# Patient Record
Sex: Female | Born: 1945 | Race: White | Hispanic: No | Smoking: Never smoker
Health system: Southern US, Community
[De-identification: ages and names within clinical notes are randomized; demographics above are authoritative.]

## PROBLEM LIST (undated history)

## (undated) DIAGNOSIS — Z87442 Personal history of urinary calculi: Secondary | ICD-10-CM

## (undated) DIAGNOSIS — Z8489 Family history of other specified conditions: Secondary | ICD-10-CM

## (undated) DIAGNOSIS — E785 Hyperlipidemia, unspecified: Secondary | ICD-10-CM

## (undated) DIAGNOSIS — K219 Gastro-esophageal reflux disease without esophagitis: Secondary | ICD-10-CM

## (undated) DIAGNOSIS — R0602 Shortness of breath: Secondary | ICD-10-CM

## (undated) DIAGNOSIS — F329 Major depressive disorder, single episode, unspecified: Secondary | ICD-10-CM

## (undated) DIAGNOSIS — M1990 Unspecified osteoarthritis, unspecified site: Secondary | ICD-10-CM

## (undated) DIAGNOSIS — D649 Anemia, unspecified: Secondary | ICD-10-CM

## (undated) DIAGNOSIS — E039 Hypothyroidism, unspecified: Secondary | ICD-10-CM

## (undated) DIAGNOSIS — F32A Depression, unspecified: Secondary | ICD-10-CM

## (undated) DIAGNOSIS — I10 Essential (primary) hypertension: Secondary | ICD-10-CM

## (undated) DIAGNOSIS — C50919 Malignant neoplasm of unspecified site of unspecified female breast: Secondary | ICD-10-CM

## (undated) HISTORY — PX: CARDIAC CATHETERIZATION: SHX172

## (undated) HISTORY — DX: Essential (primary) hypertension: I10

## (undated) HISTORY — DX: Hyperlipidemia, unspecified: E78.5

## (undated) HISTORY — DX: Malignant neoplasm of unspecified site of unspecified female breast: C50.919

## (undated) HISTORY — PX: TONSILLECTOMY: SUR1361

## (undated) HISTORY — PX: VARICOSE VEIN SURGERY: SHX832

## (undated) HISTORY — PX: ABDOMINAL HYSTERECTOMY: SHX81

## (undated) HISTORY — PX: TRIGGER FINGER RELEASE: SHX641

---
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
## 1997-06-03 HISTORY — PX: OTHER SURGICAL HISTORY: SHX169

## 1997-06-03 HISTORY — PX: MASTECTOMY: SHX3

## 1998-03-21 ENCOUNTER — Encounter: Admission: RE | Admit: 1998-03-21 | Discharge: 1998-06-19 | Payer: Self-pay | Admitting: Radiation Oncology

## 2000-06-03 HISTORY — PX: DILATION AND CURETTAGE OF UTERUS: SHX78

## 2002-06-03 HISTORY — PX: VESICOVAGINAL FISTULA CLOSURE W/ TAH: SUR271

## 2002-06-03 HISTORY — PX: ABDOMINAL HYSTERECTOMY: SHX81

## 2007-02-09 ENCOUNTER — Ambulatory Visit: Payer: Self-pay | Admitting: Vascular Surgery

## 2007-05-12 ENCOUNTER — Ambulatory Visit: Payer: Self-pay | Admitting: Vascular Surgery

## 2007-06-22 ENCOUNTER — Ambulatory Visit: Payer: Self-pay | Admitting: Vascular Surgery

## 2007-06-30 ENCOUNTER — Ambulatory Visit: Payer: Self-pay | Admitting: Vascular Surgery

## 2007-08-27 ENCOUNTER — Ambulatory Visit: Payer: Self-pay | Admitting: Vascular Surgery

## 2007-12-08 ENCOUNTER — Ambulatory Visit: Payer: Self-pay | Admitting: Vascular Surgery

## 2008-09-28 ENCOUNTER — Encounter: Payer: Self-pay | Admitting: Pulmonary Disease

## 2008-09-30 ENCOUNTER — Encounter: Payer: Self-pay | Admitting: Pulmonary Disease

## 2008-10-06 ENCOUNTER — Encounter: Payer: Self-pay | Admitting: Pulmonary Disease

## 2008-11-11 ENCOUNTER — Encounter: Payer: Self-pay | Admitting: Pulmonary Disease

## 2008-11-28 ENCOUNTER — Ambulatory Visit: Payer: Self-pay | Admitting: Pulmonary Disease

## 2008-11-28 DIAGNOSIS — R05 Cough: Secondary | ICD-10-CM

## 2008-11-28 DIAGNOSIS — E785 Hyperlipidemia, unspecified: Secondary | ICD-10-CM

## 2008-11-28 DIAGNOSIS — I10 Essential (primary) hypertension: Secondary | ICD-10-CM | POA: Insufficient documentation

## 2008-11-28 DIAGNOSIS — J45909 Unspecified asthma, uncomplicated: Secondary | ICD-10-CM | POA: Insufficient documentation

## 2008-11-28 DIAGNOSIS — R0609 Other forms of dyspnea: Secondary | ICD-10-CM

## 2008-11-28 DIAGNOSIS — R0989 Other specified symptoms and signs involving the circulatory and respiratory systems: Secondary | ICD-10-CM | POA: Insufficient documentation

## 2008-11-28 HISTORY — DX: Essential (primary) hypertension: I10

## 2008-12-06 ENCOUNTER — Encounter: Payer: Self-pay | Admitting: Pulmonary Disease

## 2008-12-06 ENCOUNTER — Ambulatory Visit: Payer: Self-pay

## 2008-12-27 ENCOUNTER — Ambulatory Visit: Payer: Self-pay | Admitting: Vascular Surgery

## 2008-12-29 ENCOUNTER — Ambulatory Visit: Payer: Self-pay | Admitting: Pulmonary Disease

## 2008-12-29 DIAGNOSIS — I519 Heart disease, unspecified: Secondary | ICD-10-CM

## 2008-12-29 HISTORY — DX: Heart disease, unspecified: I51.9

## 2010-10-16 NOTE — Assessment & Plan Note (Signed)
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  She also had sclerotherapy.  She has  done well since that time but in May of this year took a 1 week driving  trip to [place].  She drove most of the route and noted that swelling  started becoming an issue and after she returned she had bilateral lower  extremity edema, right worse than left.  She has had hyperpigmentation  in both lower legs since prior to her ablation procedure.  Since  returning and being treated with diuretics her swelling has resolved and  her status is now the same as it was prior to the trip.  She has no  history of deep venous thrombosis or acute thrombophlebitis and has had  no dyspnea on exertion or hemoptysis.   PHYSICAL EXAMINATION:  Vital signs:  Today her blood pressure is 146/96,  heart rate 68, respirations 14.  Neck:  Her carotid pulses are 3+, no  audible bruits.  Chest:  Clear to auscultation.  Cardiovascular:  Regular rhythm.  No murmurs.  Lower extremity:  Exam reveals 3+ femoral  and dorsalis pedis pulses bilaterally.  Both legs have no significant  edema but do have some hyperpigmentation in the distal third with no  active ulceration and no large varicosities are noted.   Venous duplex exam of the right leg reveals a complete ablation of the  right great saphenous vein from the saphenofemoral junction to the knee  with some mild reflux in a small lateral branch.  There is no evidence  of deep venous obstruction.   I reassured her regarding these findings and I suspect that the edema  was due to riding in a dependent position for 7 days and some fluid  overload.  If she has further problems she will be in touch with [place].   Quita Skye Hart Rochester, M.D.  Electronically Signed   JDL/MEDQ  D:  12/27/2008  T:  12/28/2008  Job:  [id number]   cc:   Philemon Kingdom

## 2010-10-16 NOTE — Assessment & Plan Note (Signed)
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  She has  continued to have aching, burning, and throbbing discomfort in the  distal thigh and calf, particularly on the right side, even though she  has been wearing her elastic compression stockings.  She has tried  elevation of the leg, as well as analgesics with no success.  She  developed swelling in the foot and ankle area toward the end of the day  and has had significant hyperpigmentation on the medial aspect of the  lower third of both legs over the last several years.  She has not had  any stasis ulcer or bleeding, but the symptoms are attempting to effect  her daily living.  Ultrasound confirmed reflux of the right  saphenofemoral junction and the greater saphenous vein with a patent  deep system.  I think she is an excellent candidate for laser ablation  of her right greater saphenous vein and will require 2 sessions of  sclerotherapy in the calf and thigh for secondary varicosities.  One of  these can be done in conjunction with the laser ablation and one at a  later date.  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  This may require  closure with either sclerotherapy or the laser.  We will proceed with  precertification for the right leg and then perform her procedure in the  near future, as soon as this has been precertified.   Katelyn Watson, M.D.  Electronically Signed   JDL/MEDQ  D:  05/12/2007  T:  05/13/2007  Job:  [id number]

## 2010-10-16 NOTE — Assessment & Plan Note (Signed)
OFFICE VISIT   KRYSTOL, ROCCO  DOB:  1945/11/22                                       06/30/2007  EAVWU#:[id number]   The patient underwent laser ablation of the right greater saphenous vein  and sclerotherapy with some secondary varicosities 1 week ago.  She did  have moderate to severe discomfort in the right proximal thigh and  inguinal area for a few days following her laser ablation treatment.  She has been taking ibuprofen as prescribed.  She also had some  blistering around the thigh from her elastic portion of the long-leg  compression stocking.  This is all improved and now the discomfort is  much better, but still present.  She has no distal edema.   EXAM:  There is some mild tenderness along the course of the greater  saphenous vein as one would expect, with some mild erythema.  Skin  irritation where the blistering had occurred, but this is improving.  I  performed a venous duplex exam today and the saphenofemoral junction is  patent, but the saphenous vein is occluded from 2 cm from the junction  to the knee level, and not compressible.  Deep system is normal in  appearance with good flow.  She was reassured regarding this.  She will  return in the near future for further sclerotherapy by Marisue Ivan and return in  6 months for followup duplex scan of the right leg.  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   Quita Skye Hart Rochester, M.D.  Electronically Signed   JDL/MEDQ  D:  06/30/2007  T:  07/01/2007  Job:  [id number]

## 2010-10-16 NOTE — Procedures (Signed)
DUPLEX DEEP VENOUS EXAM - LOWER EXTREMITY   INDICATION:  Followup, right greater saphenous vein laser ablation.   HISTORY:  Edema:  No.  Trauma/Surgery:  Right greater saphenous vein laser ablation on  06/22/07.  Pain:  No.  PE:  No.  Previous DVT:  No.  Anticoagulants:  No.  Other:   DUPLEX EXAM:                CFV   SFV   PopV  PTV    GSV                R  L  R  L  R  L  R   L  R  L  Thrombosis    o  o  o     o     o      +  Spontaneous   +  +  +     +     +      0  Phasic        +  +  +     +     +      0  Augmentation  +  +  +     +     +      0  Compressible  +  +  +     +     +      0  Competent   Legend:  + - yes  o - no  p - partial  D - decreased   IMPRESSION:  1. No evidence of deep venous thrombosis noted in the right lower      extremity.  2. Totally occluded right greater saphenous vein with no evidence of      reflux noted at the saphenofemoral junction.    _____________________________  Quita Skye Hart Rochester, M.D.   CH/MEDQ  D:  12/08/2007  T:  12/08/2007  Job:  [id number]

## 2010-10-16 NOTE — Procedures (Signed)
LOWER EXTREMITY VENOUS REFLUX EXAM   INDICATION:  Bilateral legs varicose veins and pain.   EXAM:  Using color-flow imaging and pulse Doppler spectral analysis, the  right and left common femoral, superficial femoral vein, posterior  tibial, greater and lesser saphenous veins are evaluated.  There is no  evidence suggesting deep venous insufficiency in the right and left  lower extremity.  The right saphenofemoral junction is not competent.  The right GSV is not competent with the caliber as described below.The  right and left proximal short saphenous veins demonstrate competency.   GSV Diameter (used if found to be incompetent only)                                            Right    Left  Proximal Greater Saphenous Vein           .58 cm   .52 cm  Proximal-to-mid-thigh                     .5 cm    .41 cm  Mid thigh                                 .45 cm   .41 cm  Mid-distal thigh                          .45 cm   .35 cm  Distal thigh                              .33 cm   .35 cm  Knee                                      .39 cm   __________ cm   IMPRESSION:  1. Right greater saphenous vein reflux is identified with the caliber      ranging from 0.39 cm to 0.80 cm knee to groin.  2. The right and left greater saphenous vein is not aneurysmal.  3. The right and left greater saphenous vein is not tortuous.  4. The deep venous system is competent.  5. The right and left lesser saphenous vein is competent.  6. Perforators noted in bilateral legs at the level of discoloration.  7. No evidence of deep venous thrombosis noted in bilateral legs.   ___________________________________________  Quita Skye Hart Rochester, M.D.   MG/MEDQ  D:  02/09/2007  T:  02/10/2007  Job:  [id number]

## 2010-10-16 NOTE — Procedures (Signed)
DUPLEX DEEP VENOUS EXAM - LOWER EXTREMITY   INDICATION:  Swelling, bilateral lower extremities R > L after a driving  trip in May, better now.   HISTORY:  Edema:  None now, had in May after driving trip  Trauma/Surgery:  Greater saphenous vein ablation June 22, 2007, by  Dr. Hart Rochester  Pain:  No  PE:  No  Previous DVT:  No  Anticoagulants:  No  Other:   DUPLEX EXAM:                CFV   SFV   PopV  PTV    GSV                R  L  R  L  R  L  R   L  R  L  Thrombosis    o  o  o  o  o  o  o   o  +  o  Spontaneous   +  +  +  +  +  +  +   +  o  +  Phasic        +  +  +  +  +  +  +   +  o  +  Augmentation  +  +  +  +  +  +  +   +  o  +  Compressible  +  +  +  +  +  +  +   +  o  +  Competent     +  +  +  +  +  +  +   +  o  P   Legend:  + - yes  o - no  p - partial  D - decreased   IMPRESSION:  1. No evidence of deep vein thrombosis in bilateral lower extremities.  2. Right greater saphenous vein shows evidence of ablation without      flow from groin to knee.  Right small lateral tortuous branch with      mild reflux noted.  3. Left greater saphenous vein shows evidence of mild reflux.  4. Bilateral incompetent perforators as previously noted.    _____________________________  Quita Skye Hart Rochester, M.D.   AS/MEDQ  D:  12/27/2008  T:  12/28/2008  Job:  [id number]

## 2010-10-16 NOTE — Consult Note (Signed)
VASCULAR SURGERY CONSULTATION   Katelyn Watson, Katelyn Watson  DOB:  1945/07/13                                       02/09/2007  ZOXWR#:[id number]   The patient is a 65 year old female patient who has had increasingly  symptomatic varicose veins in both legs with the right worse than the  left.  She describes an aching, burning, throbbing discomfort which  begins in the distal thigh extending into the calf, worse on the right  than the left side.  This then develops numbness in her right leg with  standing which is only relieved by elevation of the legs.  She has tried  some mild pain medication without improvement.  She has not worn elastic  compression stockings.  She develops swelling toward the end of the day.  She has no history of thrombophlebitis or deep venous thrombosis, stasis  ulcer, or bleeding but states that these symptoms are definitely  affecting her daily living because of the pain involved.   PAST MEDICAL HISTORY:  Positive for hypertension, hyperlipidemia,  asthma, and a history of breast cancer, having undergone left mastectomy  in 1999.  She has no history of diabetes, coronary artery disease, or  COPD.   PREVIOUS SURGERY:  Left mastectomy.   FAMILY HISTORY:  Positive for coronary artery disease in her mother and  father.  Positive for diabetes in a brother.  Negative for strokes.   SOCIAL HISTORY:  She is widowed.  Has no children.  Is retired.  She  does not use tobacco or alcohol.   ALLERGIES:  NONE KNOWN.   MEDICATIONS AND REVIEW OF SYSTEMS:  Please see health history form.   PHYSICAL EXAM:  Vital signs:  Blood pressure is 130/90, heart rate 72,  respiratory rate 16.  General:  She is a healthy female patient who is  in no apparent distress.  She is alert and oriented x3.  Neck:  Supple  with 3+ carotid pulses palpable.  No bruits are audible.  Neurologic  exam:  Normal.  She has no palpable adenopathy in the neck.  Extremities:  Upper extremity  pulses are 3+ bilaterally.  Chest:  Clear  to auscultation.  Cardiovascular exam:  Regular rhythm with no murmurs.  Abdomen:  Obese.  No palpable masses.  Vascular:  She has 3+ femoral,  popliteal, and dorsalis pedis pulses palpable bilaterally.  There is  hyperpigmentation involving the lower third of both legs medially.  There is no ulceration.  On the right side, she has prominent  varicosities of the greater saphenous system in the distal thigh  extending down into the medial calf, particularly in the pretibial  region.  Left side has smaller varicosities in the distal thigh and into  the medial calf below the knee.  No posterior varicosities are noted.   Venous duplex exam was performed in our office today.  She does have  reflux at the right saphenofemoral junction and throughout the right  greater saphenous vein.  There is no reflux noted in the left greater  saphenous vein or in the small saphenous veins bilaterally.  She also  has two competent perforators, one on the lower third of the right leg  in the area of hyperpigmentation and a similar one on the left leg in  the area of hyperpigmentation.   We will treat initially with elastic compression  stockings, elevation,  analgesics, and see if this improves her symptoms.  She will return in  three months for further followup, and if she has had no improvement, I  think laser ablation to the right greater saphenous vein with stab  phlebectomies and/or sclerotherapy would be indicated.  She may later  require laser ablation of incompetent perforators in the lower third of  both legs.   Quita Skye Hart Rochester, M.D.  Electronically Signed  JDL/MEDQ  D:  02/09/2007  T:  02/10/2007  Job:  [id number]   cc:   Philemon Kingdom

## 2010-10-16 NOTE — Assessment & Plan Note (Signed)
OFFICE VISIT   Katelyn Watson, Katelyn Watson  DOB:  [birth year]                                       12/08/2007  QMVHQ#:[id number]   The patient returns 6 months post laser ablation of her right great  saphenous vein followed by sclerotherapy.  She also had sclerotherapy  performed of the left leg.  She had a venous duplex exam performed today  of the right leg which reveals total occlusion of the greater saphenous  vein from the saphenofemoral junction distally and no evidence of deep  venous thrombosis.   EXAM:  She continues to have hyperpigmentation in the lower part of both  legs with chronic venous insufficiency and I encouraged her to continue  to wear short leg elastic compression stockings.  She has palpable  arterial pulses and does have some scattered spider veins.   She is pleased with her results and states that the discomfort is much  less than she experienced before, and will return to see [place] on a p.r.n.  basis.   Quita Skye Hart Rochester, M.D.  Electronically Signed   JDL/MEDQ  D:  12/08/2007  T:  12/09/2007  Job:  [id number]

## 2011-05-08 DIAGNOSIS — C50919 Malignant neoplasm of unspecified site of unspecified female breast: Secondary | ICD-10-CM | POA: Insufficient documentation

## 2011-06-20 DIAGNOSIS — M1710 Unilateral primary osteoarthritis, unspecified knee: Secondary | ICD-10-CM | POA: Diagnosis not present

## 2011-06-20 DIAGNOSIS — M25569 Pain in unspecified knee: Secondary | ICD-10-CM | POA: Diagnosis not present

## 2011-06-27 DIAGNOSIS — M25569 Pain in unspecified knee: Secondary | ICD-10-CM | POA: Diagnosis not present

## 2011-07-03 DIAGNOSIS — M25569 Pain in unspecified knee: Secondary | ICD-10-CM | POA: Diagnosis not present

## 2011-07-22 DIAGNOSIS — E785 Hyperlipidemia, unspecified: Secondary | ICD-10-CM | POA: Diagnosis not present

## 2011-07-22 DIAGNOSIS — I10 Essential (primary) hypertension: Secondary | ICD-10-CM | POA: Diagnosis not present

## 2011-07-22 DIAGNOSIS — Z79899 Other long term (current) drug therapy: Secondary | ICD-10-CM | POA: Diagnosis not present

## 2011-07-22 DIAGNOSIS — E039 Hypothyroidism, unspecified: Secondary | ICD-10-CM | POA: Diagnosis not present

## 2011-07-22 DIAGNOSIS — R609 Edema, unspecified: Secondary | ICD-10-CM | POA: Diagnosis not present

## 2011-07-27 DIAGNOSIS — IMO0002 Reserved for concepts with insufficient information to code with codable children: Secondary | ICD-10-CM | POA: Diagnosis not present

## 2011-07-27 DIAGNOSIS — A938 Other specified arthropod-borne viral fevers: Secondary | ICD-10-CM | POA: Diagnosis not present

## 2011-08-16 DIAGNOSIS — Z8639 Personal history of other endocrine, nutritional and metabolic disease: Secondary | ICD-10-CM | POA: Diagnosis not present

## 2011-08-16 DIAGNOSIS — Z862 Personal history of diseases of the blood and blood-forming organs and certain disorders involving the immune mechanism: Secondary | ICD-10-CM | POA: Diagnosis not present

## 2011-08-16 DIAGNOSIS — Z923 Personal history of irradiation: Secondary | ICD-10-CM | POA: Diagnosis not present

## 2011-08-16 DIAGNOSIS — E041 Nontoxic single thyroid nodule: Secondary | ICD-10-CM | POA: Diagnosis not present

## 2011-09-03 DIAGNOSIS — M25569 Pain in unspecified knee: Secondary | ICD-10-CM | POA: Diagnosis not present

## 2011-09-03 DIAGNOSIS — M1710 Unilateral primary osteoarthritis, unspecified knee: Secondary | ICD-10-CM | POA: Diagnosis not present

## 2011-09-10 DIAGNOSIS — G4700 Insomnia, unspecified: Secondary | ICD-10-CM | POA: Diagnosis not present

## 2011-09-10 DIAGNOSIS — I10 Essential (primary) hypertension: Secondary | ICD-10-CM | POA: Diagnosis not present

## 2011-10-04 DIAGNOSIS — H40019 Open angle with borderline findings, low risk, unspecified eye: Secondary | ICD-10-CM | POA: Diagnosis not present

## 2011-10-21 DIAGNOSIS — K219 Gastro-esophageal reflux disease without esophagitis: Secondary | ICD-10-CM | POA: Diagnosis not present

## 2011-10-21 DIAGNOSIS — I10 Essential (primary) hypertension: Secondary | ICD-10-CM | POA: Diagnosis not present

## 2011-10-21 DIAGNOSIS — F329 Major depressive disorder, single episode, unspecified: Secondary | ICD-10-CM | POA: Diagnosis not present

## 2011-10-21 DIAGNOSIS — E785 Hyperlipidemia, unspecified: Secondary | ICD-10-CM | POA: Diagnosis not present

## 2011-10-21 DIAGNOSIS — Z79899 Other long term (current) drug therapy: Secondary | ICD-10-CM | POA: Diagnosis not present

## 2011-10-21 DIAGNOSIS — J309 Allergic rhinitis, unspecified: Secondary | ICD-10-CM | POA: Diagnosis not present

## 2011-10-21 DIAGNOSIS — N390 Urinary tract infection, site not specified: Secondary | ICD-10-CM | POA: Diagnosis not present

## 2011-10-21 DIAGNOSIS — E039 Hypothyroidism, unspecified: Secondary | ICD-10-CM | POA: Diagnosis not present

## 2012-02-21 DIAGNOSIS — K219 Gastro-esophageal reflux disease without esophagitis: Secondary | ICD-10-CM | POA: Diagnosis not present

## 2012-02-21 DIAGNOSIS — E039 Hypothyroidism, unspecified: Secondary | ICD-10-CM | POA: Diagnosis not present

## 2012-02-21 DIAGNOSIS — F329 Major depressive disorder, single episode, unspecified: Secondary | ICD-10-CM | POA: Diagnosis not present

## 2012-02-21 DIAGNOSIS — R609 Edema, unspecified: Secondary | ICD-10-CM | POA: Diagnosis not present

## 2012-02-21 DIAGNOSIS — E785 Hyperlipidemia, unspecified: Secondary | ICD-10-CM | POA: Diagnosis not present

## 2012-02-21 DIAGNOSIS — I10 Essential (primary) hypertension: Secondary | ICD-10-CM | POA: Diagnosis not present

## 2012-02-21 DIAGNOSIS — Z79899 Other long term (current) drug therapy: Secondary | ICD-10-CM | POA: Diagnosis not present

## 2012-03-31 DIAGNOSIS — M25569 Pain in unspecified knee: Secondary | ICD-10-CM | POA: Diagnosis not present

## 2012-03-31 DIAGNOSIS — M79609 Pain in unspecified limb: Secondary | ICD-10-CM | POA: Diagnosis not present

## 2012-03-31 DIAGNOSIS — R609 Edema, unspecified: Secondary | ICD-10-CM | POA: Diagnosis not present

## 2012-04-24 DIAGNOSIS — M25569 Pain in unspecified knee: Secondary | ICD-10-CM | POA: Diagnosis not present

## 2012-05-22 DIAGNOSIS — M25569 Pain in unspecified knee: Secondary | ICD-10-CM | POA: Diagnosis not present

## 2012-06-05 DIAGNOSIS — R922 Inconclusive mammogram: Secondary | ICD-10-CM | POA: Diagnosis not present

## 2012-06-05 DIAGNOSIS — Z1231 Encounter for screening mammogram for malignant neoplasm of breast: Secondary | ICD-10-CM | POA: Diagnosis not present

## 2012-06-05 DIAGNOSIS — Z853 Personal history of malignant neoplasm of breast: Secondary | ICD-10-CM | POA: Diagnosis not present

## 2012-06-05 DIAGNOSIS — C50919 Malignant neoplasm of unspecified site of unspecified female breast: Secondary | ICD-10-CM | POA: Diagnosis not present

## 2012-06-05 DIAGNOSIS — Z9010 Acquired absence of unspecified breast and nipple: Secondary | ICD-10-CM | POA: Diagnosis not present

## 2012-06-05 DIAGNOSIS — I10 Essential (primary) hypertension: Secondary | ICD-10-CM | POA: Diagnosis not present

## 2012-06-22 DIAGNOSIS — I10 Essential (primary) hypertension: Secondary | ICD-10-CM | POA: Diagnosis not present

## 2012-06-22 DIAGNOSIS — J0100 Acute maxillary sinusitis, unspecified: Secondary | ICD-10-CM | POA: Diagnosis not present

## 2012-06-22 DIAGNOSIS — E039 Hypothyroidism, unspecified: Secondary | ICD-10-CM | POA: Diagnosis not present

## 2012-06-22 DIAGNOSIS — Z79899 Other long term (current) drug therapy: Secondary | ICD-10-CM | POA: Diagnosis not present

## 2012-06-22 DIAGNOSIS — E785 Hyperlipidemia, unspecified: Secondary | ICD-10-CM | POA: Diagnosis not present

## 2012-06-22 DIAGNOSIS — K219 Gastro-esophageal reflux disease without esophagitis: Secondary | ICD-10-CM | POA: Diagnosis not present

## 2012-07-20 DIAGNOSIS — G4700 Insomnia, unspecified: Secondary | ICD-10-CM | POA: Diagnosis not present

## 2012-07-20 DIAGNOSIS — M2550 Pain in unspecified joint: Secondary | ICD-10-CM | POA: Diagnosis not present

## 2012-07-20 DIAGNOSIS — I10 Essential (primary) hypertension: Secondary | ICD-10-CM | POA: Diagnosis not present

## 2012-08-10 DIAGNOSIS — R1032 Left lower quadrant pain: Secondary | ICD-10-CM | POA: Diagnosis not present

## 2012-08-10 DIAGNOSIS — Z79899 Other long term (current) drug therapy: Secondary | ICD-10-CM | POA: Diagnosis not present

## 2012-08-10 DIAGNOSIS — I10 Essential (primary) hypertension: Secondary | ICD-10-CM | POA: Diagnosis not present

## 2012-08-21 DIAGNOSIS — E063 Autoimmune thyroiditis: Secondary | ICD-10-CM | POA: Diagnosis not present

## 2012-08-28 DIAGNOSIS — M1710 Unilateral primary osteoarthritis, unspecified knee: Secondary | ICD-10-CM | POA: Diagnosis not present

## 2012-09-07 DIAGNOSIS — Z79899 Other long term (current) drug therapy: Secondary | ICD-10-CM | POA: Diagnosis not present

## 2012-09-07 DIAGNOSIS — I10 Essential (primary) hypertension: Secondary | ICD-10-CM | POA: Diagnosis not present

## 2012-09-07 DIAGNOSIS — M62838 Other muscle spasm: Secondary | ICD-10-CM | POA: Diagnosis not present

## 2012-10-20 DIAGNOSIS — M1710 Unilateral primary osteoarthritis, unspecified knee: Secondary | ICD-10-CM | POA: Diagnosis not present

## 2012-10-20 DIAGNOSIS — M25569 Pain in unspecified knee: Secondary | ICD-10-CM | POA: Diagnosis not present

## 2012-11-12 DIAGNOSIS — R062 Wheezing: Secondary | ICD-10-CM | POA: Diagnosis not present

## 2012-11-12 DIAGNOSIS — J209 Acute bronchitis, unspecified: Secondary | ICD-10-CM | POA: Diagnosis not present

## 2012-12-03 DIAGNOSIS — H40019 Open angle with borderline findings, low risk, unspecified eye: Secondary | ICD-10-CM | POA: Diagnosis not present

## 2012-12-16 DIAGNOSIS — E785 Hyperlipidemia, unspecified: Secondary | ICD-10-CM | POA: Diagnosis not present

## 2012-12-16 DIAGNOSIS — I10 Essential (primary) hypertension: Secondary | ICD-10-CM | POA: Diagnosis not present

## 2012-12-16 DIAGNOSIS — Z79899 Other long term (current) drug therapy: Secondary | ICD-10-CM | POA: Diagnosis not present

## 2012-12-16 DIAGNOSIS — K219 Gastro-esophageal reflux disease without esophagitis: Secondary | ICD-10-CM | POA: Diagnosis not present

## 2012-12-16 DIAGNOSIS — R609 Edema, unspecified: Secondary | ICD-10-CM | POA: Diagnosis not present

## 2012-12-16 DIAGNOSIS — E039 Hypothyroidism, unspecified: Secondary | ICD-10-CM | POA: Diagnosis not present

## 2013-02-08 DIAGNOSIS — S8010XA Contusion of unspecified lower leg, initial encounter: Secondary | ICD-10-CM | POA: Diagnosis not present

## 2013-02-25 DIAGNOSIS — M1710 Unilateral primary osteoarthritis, unspecified knee: Secondary | ICD-10-CM | POA: Diagnosis not present

## 2013-02-25 DIAGNOSIS — M25569 Pain in unspecified knee: Secondary | ICD-10-CM | POA: Diagnosis not present

## 2013-04-01 DIAGNOSIS — Z23 Encounter for immunization: Secondary | ICD-10-CM | POA: Diagnosis not present

## 2013-04-19 DIAGNOSIS — M2550 Pain in unspecified joint: Secondary | ICD-10-CM | POA: Diagnosis not present

## 2013-04-19 DIAGNOSIS — E039 Hypothyroidism, unspecified: Secondary | ICD-10-CM | POA: Diagnosis not present

## 2013-04-19 DIAGNOSIS — H6120 Impacted cerumen, unspecified ear: Secondary | ICD-10-CM | POA: Diagnosis not present

## 2013-04-19 DIAGNOSIS — E785 Hyperlipidemia, unspecified: Secondary | ICD-10-CM | POA: Diagnosis not present

## 2013-04-19 DIAGNOSIS — I10 Essential (primary) hypertension: Secondary | ICD-10-CM | POA: Diagnosis not present

## 2013-04-19 DIAGNOSIS — J309 Allergic rhinitis, unspecified: Secondary | ICD-10-CM | POA: Diagnosis not present

## 2013-04-19 DIAGNOSIS — R05 Cough: Secondary | ICD-10-CM | POA: Diagnosis not present

## 2013-04-19 DIAGNOSIS — Z79899 Other long term (current) drug therapy: Secondary | ICD-10-CM | POA: Diagnosis not present

## 2013-04-21 DIAGNOSIS — H6120 Impacted cerumen, unspecified ear: Secondary | ICD-10-CM | POA: Diagnosis not present

## 2013-05-12 DIAGNOSIS — E876 Hypokalemia: Secondary | ICD-10-CM | POA: Diagnosis not present

## 2013-05-13 DIAGNOSIS — J45909 Unspecified asthma, uncomplicated: Secondary | ICD-10-CM | POA: Diagnosis not present

## 2013-05-13 DIAGNOSIS — J309 Allergic rhinitis, unspecified: Secondary | ICD-10-CM | POA: Diagnosis not present

## 2013-05-13 DIAGNOSIS — E876 Hypokalemia: Secondary | ICD-10-CM | POA: Diagnosis not present

## 2013-05-13 DIAGNOSIS — R5381 Other malaise: Secondary | ICD-10-CM | POA: Diagnosis not present

## 2013-05-21 DIAGNOSIS — L02419 Cutaneous abscess of limb, unspecified: Secondary | ICD-10-CM | POA: Diagnosis not present

## 2013-05-21 DIAGNOSIS — R609 Edema, unspecified: Secondary | ICD-10-CM | POA: Diagnosis not present

## 2013-06-09 DIAGNOSIS — R922 Inconclusive mammogram: Secondary | ICD-10-CM | POA: Diagnosis not present

## 2013-06-09 DIAGNOSIS — C50919 Malignant neoplasm of unspecified site of unspecified female breast: Secondary | ICD-10-CM | POA: Diagnosis not present

## 2013-06-09 DIAGNOSIS — Z1231 Encounter for screening mammogram for malignant neoplasm of breast: Secondary | ICD-10-CM | POA: Diagnosis not present

## 2013-06-10 DIAGNOSIS — R5383 Other fatigue: Secondary | ICD-10-CM | POA: Diagnosis not present

## 2013-06-10 DIAGNOSIS — M19049 Primary osteoarthritis, unspecified hand: Secondary | ICD-10-CM | POA: Diagnosis not present

## 2013-06-10 DIAGNOSIS — R894 Abnormal immunological findings in specimens from other organs, systems and tissues: Secondary | ICD-10-CM | POA: Diagnosis not present

## 2013-06-10 DIAGNOSIS — R5381 Other malaise: Secondary | ICD-10-CM | POA: Diagnosis not present

## 2013-06-10 DIAGNOSIS — M7730 Calcaneal spur, unspecified foot: Secondary | ICD-10-CM | POA: Diagnosis not present

## 2013-06-10 DIAGNOSIS — M1990 Unspecified osteoarthritis, unspecified site: Secondary | ICD-10-CM | POA: Diagnosis not present

## 2013-06-10 DIAGNOSIS — M2550 Pain in unspecified joint: Secondary | ICD-10-CM | POA: Diagnosis not present

## 2013-07-01 DIAGNOSIS — M1990 Unspecified osteoarthritis, unspecified site: Secondary | ICD-10-CM | POA: Diagnosis not present

## 2013-07-01 DIAGNOSIS — M2550 Pain in unspecified joint: Secondary | ICD-10-CM | POA: Diagnosis not present

## 2013-07-01 DIAGNOSIS — R894 Abnormal immunological findings in specimens from other organs, systems and tissues: Secondary | ICD-10-CM | POA: Diagnosis not present

## 2013-08-05 DIAGNOSIS — G4700 Insomnia, unspecified: Secondary | ICD-10-CM | POA: Diagnosis not present

## 2013-08-05 DIAGNOSIS — E039 Hypothyroidism, unspecified: Secondary | ICD-10-CM | POA: Diagnosis not present

## 2013-08-05 DIAGNOSIS — Z79899 Other long term (current) drug therapy: Secondary | ICD-10-CM | POA: Diagnosis not present

## 2013-08-05 DIAGNOSIS — I10 Essential (primary) hypertension: Secondary | ICD-10-CM | POA: Diagnosis not present

## 2013-08-05 DIAGNOSIS — E785 Hyperlipidemia, unspecified: Secondary | ICD-10-CM | POA: Diagnosis not present

## 2013-08-05 DIAGNOSIS — K219 Gastro-esophageal reflux disease without esophagitis: Secondary | ICD-10-CM | POA: Diagnosis not present

## 2013-08-05 DIAGNOSIS — R609 Edema, unspecified: Secondary | ICD-10-CM | POA: Diagnosis not present

## 2013-08-27 DIAGNOSIS — E063 Autoimmune thyroiditis: Secondary | ICD-10-CM | POA: Diagnosis not present

## 2013-09-20 DIAGNOSIS — M1710 Unilateral primary osteoarthritis, unspecified knee: Secondary | ICD-10-CM | POA: Diagnosis not present

## 2013-09-20 DIAGNOSIS — IMO0002 Reserved for concepts with insufficient information to code with codable children: Secondary | ICD-10-CM | POA: Diagnosis not present

## 2013-10-07 DIAGNOSIS — M25569 Pain in unspecified knee: Secondary | ICD-10-CM | POA: Diagnosis not present

## 2013-10-07 DIAGNOSIS — M1710 Unilateral primary osteoarthritis, unspecified knee: Secondary | ICD-10-CM | POA: Diagnosis not present

## 2013-11-09 DIAGNOSIS — D235 Other benign neoplasm of skin of trunk: Secondary | ICD-10-CM | POA: Diagnosis not present

## 2013-11-09 DIAGNOSIS — L259 Unspecified contact dermatitis, unspecified cause: Secondary | ICD-10-CM | POA: Diagnosis not present

## 2013-11-09 DIAGNOSIS — L723 Sebaceous cyst: Secondary | ICD-10-CM | POA: Diagnosis not present

## 2013-11-18 DIAGNOSIS — J019 Acute sinusitis, unspecified: Secondary | ICD-10-CM | POA: Diagnosis not present

## 2013-12-07 DIAGNOSIS — E785 Hyperlipidemia, unspecified: Secondary | ICD-10-CM | POA: Diagnosis not present

## 2013-12-07 DIAGNOSIS — F3289 Other specified depressive episodes: Secondary | ICD-10-CM | POA: Diagnosis not present

## 2013-12-07 DIAGNOSIS — E039 Hypothyroidism, unspecified: Secondary | ICD-10-CM | POA: Diagnosis not present

## 2013-12-07 DIAGNOSIS — I10 Essential (primary) hypertension: Secondary | ICD-10-CM | POA: Diagnosis not present

## 2013-12-07 DIAGNOSIS — K219 Gastro-esophageal reflux disease without esophagitis: Secondary | ICD-10-CM | POA: Diagnosis not present

## 2013-12-07 DIAGNOSIS — F329 Major depressive disorder, single episode, unspecified: Secondary | ICD-10-CM | POA: Diagnosis not present

## 2013-12-07 DIAGNOSIS — Z79899 Other long term (current) drug therapy: Secondary | ICD-10-CM | POA: Diagnosis not present

## 2013-12-09 DIAGNOSIS — M1710 Unilateral primary osteoarthritis, unspecified knee: Secondary | ICD-10-CM | POA: Diagnosis not present

## 2013-12-09 DIAGNOSIS — M25569 Pain in unspecified knee: Secondary | ICD-10-CM | POA: Diagnosis not present

## 2013-12-15 DIAGNOSIS — M79609 Pain in unspecified limb: Secondary | ICD-10-CM | POA: Diagnosis not present

## 2013-12-20 DIAGNOSIS — M79609 Pain in unspecified limb: Secondary | ICD-10-CM | POA: Diagnosis not present

## 2013-12-22 DIAGNOSIS — M79609 Pain in unspecified limb: Secondary | ICD-10-CM | POA: Diagnosis not present

## 2013-12-23 DIAGNOSIS — S99919A Unspecified injury of unspecified ankle, initial encounter: Secondary | ICD-10-CM | POA: Diagnosis not present

## 2013-12-23 DIAGNOSIS — S99929A Unspecified injury of unspecified foot, initial encounter: Secondary | ICD-10-CM | POA: Diagnosis not present

## 2013-12-23 DIAGNOSIS — S8990XA Unspecified injury of unspecified lower leg, initial encounter: Secondary | ICD-10-CM | POA: Diagnosis not present

## 2013-12-27 DIAGNOSIS — M79609 Pain in unspecified limb: Secondary | ICD-10-CM | POA: Diagnosis not present

## 2013-12-28 DIAGNOSIS — I10 Essential (primary) hypertension: Secondary | ICD-10-CM | POA: Diagnosis not present

## 2013-12-28 DIAGNOSIS — K219 Gastro-esophageal reflux disease without esophagitis: Secondary | ICD-10-CM | POA: Diagnosis not present

## 2013-12-28 DIAGNOSIS — Z01818 Encounter for other preprocedural examination: Secondary | ICD-10-CM | POA: Diagnosis not present

## 2013-12-28 DIAGNOSIS — E785 Hyperlipidemia, unspecified: Secondary | ICD-10-CM | POA: Diagnosis not present

## 2013-12-28 DIAGNOSIS — E876 Hypokalemia: Secondary | ICD-10-CM | POA: Diagnosis not present

## 2013-12-28 DIAGNOSIS — F3289 Other specified depressive episodes: Secondary | ICD-10-CM | POA: Diagnosis not present

## 2013-12-28 DIAGNOSIS — F329 Major depressive disorder, single episode, unspecified: Secondary | ICD-10-CM | POA: Diagnosis not present

## 2013-12-29 DIAGNOSIS — M1990 Unspecified osteoarthritis, unspecified site: Secondary | ICD-10-CM | POA: Diagnosis not present

## 2013-12-29 DIAGNOSIS — R894 Abnormal immunological findings in specimens from other organs, systems and tissues: Secondary | ICD-10-CM | POA: Diagnosis not present

## 2013-12-29 DIAGNOSIS — M2550 Pain in unspecified joint: Secondary | ICD-10-CM | POA: Diagnosis not present

## 2013-12-31 DIAGNOSIS — M79609 Pain in unspecified limb: Secondary | ICD-10-CM | POA: Diagnosis not present

## 2014-01-03 DIAGNOSIS — M79609 Pain in unspecified limb: Secondary | ICD-10-CM | POA: Diagnosis not present

## 2014-01-06 NOTE — H&P (Signed)
TOTAL KNEE ADMISSION H&P  Patient is being admitted for left total knee arthroplasty.  Subjective:  Chief Complaint:    Left knee OA / pain.  HPI: Katelyn Watson, 68 y.o. female, has a history of pain and functional disability in the left knee due to arthritis and has failed non-surgical conservative treatments for greater than 12 weeks to include NSAID's and/or analgesics, corticosteriod injections, use of assistive devices and activity modification.  Onset of symptoms was gradual, starting 2+ years ago with gradually worsening course since that time. The patient noted no past surgery on the left knee(s).  Patient currently rates pain in the left knee(s) at 10 out of 10 with activity. Patient has night pain, worsening of pain with activity and weight bearing, pain that interferes with activities of daily living, pain with passive range of motion, crepitus and joint swelling.  Patient has evidence of periarticular osteophytes and joint space narrowing by imaging studies. There is no active infection.  Risks, benefits and expectations were discussed with the patient.  Risks including but not limited to the risk of anesthesia, blood clots, nerve damage, blood vessel damage, failure of the prosthesis, infection and up to and including death.  Patient understand the risks, benefits and expectations and wishes to proceed with surgery.   PCP: No primary provider on file.  D/C Plans:      SNF - [facility]  Post-op Meds:       No Rx given  Tranexamic Acid:      To be given - IV    Decadron:      Is to be given  FYI:     ASA post-op  Norco post-op    Patient Active Problem List   Diagnosis Date Noted  . DIASTOLIC DYSFUNCTION 43/56/8616  . HYPERLIPIDEMIA 11/28/2008  . HYPERTENSION 11/28/2008  . ASTHMA 11/28/2008  . DYSPNEA ON EXERTION 11/28/2008  . COUGH 11/28/2008   Past Medical History  Diagnosis Date  . Asthma     ??  . HLD (hyperlipidemia)   . HTN (hypertension)   . Breast cancer    treated with masectomy, chemo, and xrt    Past Surgical History  Procedure Laterality Date  . Vesicovaginal fistula closure w/ tah  2004  . Breast masectomy  1999  . Dilation and curettage of uterus  2002  . Cardiac catheterization      No prescriptions prior to admission   Allergies not on file   History  Substance Use Topics  . Smoking status: Never Smoker   . Smokeless tobacco: Not on file  . Alcohol Use: Not on file    Family History  Problem Relation Age of Onset  . Heart disease      father and brother (afib); mother (MI)  . Cancer      mother (colon); brother (melanoma, lung)     Review of Systems  Constitutional: Negative.   HENT: Negative.   Eyes: Negative.   Respiratory: Positive for cough and shortness of breath (on exertion).   Cardiovascular: Positive for leg swelling.  Gastrointestinal: Positive for heartburn.  Genitourinary: Positive for frequency.  Musculoskeletal: Positive for back pain and joint pain.  Skin: Negative.   Neurological: Negative.   Endo/Heme/Allergies: Negative.   Psychiatric/Behavioral: Positive for depression.    Objective:  Physical Exam  Constitutional: She is oriented to person, place, and time. She appears well-developed and well-nourished.  HENT:  Head: Normocephalic and atraumatic.  Mouth/Throat: Oropharynx is clear and moist.  Eyes: Pupils are  equal, round, and reactive to light.  Neck: Neck supple. No JVD present. No tracheal deviation present. No thyromegaly present.  Cardiovascular: Normal rate, regular rhythm, normal heart sounds and intact distal pulses.   Respiratory: Effort normal and breath sounds normal. No stridor. No respiratory distress. She has no wheezes.  GI: Soft. There is no tenderness. There is no guarding.  Musculoskeletal:       Left knee: She exhibits decreased range of motion, swelling and bony tenderness. She exhibits no ecchymosis, no deformity, no laceration, no erythema and normal alignment.  Tenderness found.  Lymphadenopathy:    She has no cervical adenopathy.  Neurological: She is alert and oriented to person, place, and time.  Skin: Skin is warm and dry.  Psychiatric: She has a normal mood and affect.      Labs:  Estimated body mass index is 40.63 kg/(m^2) as calculated from the following:   Height as of 12/29/08: 5' 6.5" (1.689 m).   Weight as of 12/29/08: 115.894 kg (255 lb 8 oz).   Imaging Review Plain radiographs demonstrate moderate degenerative joint disease of the left knee(s). The overall alignment is neutral. The bone quality appears to be good for age and reported activity level.  Assessment/Plan:  End stage arthritis, left knee   The patient history, physical examination, clinical judgment of the provider and imaging studies are consistent with end stage degenerative joint disease of the left knee(s) and total knee arthroplasty is deemed medically necessary. The treatment options including medical management, injection therapy arthroscopy and arthroplasty were discussed at length. The risks and benefits of total knee arthroplasty were presented and reviewed. The risks due to aseptic loosening, infection, stiffness, patella tracking problems, thromboembolic complications and other imponderables were discussed. The patient acknowledged the explanation, agreed to proceed with the plan and consent was signed. Patient is being admitted for inpatient treatment for surgery, pain control, PT, OT, prophylactic antibiotics, VTE prophylaxis, progressive ambulation and ADL's and discharge planning. The patient is planning to be discharged to skilled nursing facility.      [place] Pugh Chantele Corado   PA-C  01/06/2014, 2:57 PM

## 2014-01-10 NOTE — Patient Instructions (Signed)
Katelyn Watson  01/10/2014   Your procedure is scheduled on:  01/17/2014    Report to [facility].  Follow the Signs to [place] at  1130      am  Call this number if you have problems the morning of surgery: [phone]   Remember:   Do not eat food after midnite.  May have clear liquids until 0800am then npo.    Take these medicines the morning of surgery with A SIP OF WATER:    Do not wear jewelry, make-up or nail polish.  Do not wear lotions, powders, or perfumes, deodorant.     Men may shave face and neck.  Do not bring valuables to the hospital.  Contacts, dentures or bridgework may not be worn into surgery.  Leave suitcase in the car. After surgery it may be brought to your room.  For patients admitted to the hospital, checkout time is 11:00 AM the day of  discharge.         CLEAR LIQUID DIET   Foods Allowed                                                                     Foods Excluded  Coffee and tea, regular and decaf                             liquids that you cannot  Plain Jell-O in any flavor                                             see through such as: Fruit ices (not with fruit pulp)                                     milk, soups, orange juice  Iced Popsicles                                    All solid food Carbonated beverages, regular and diet                                    Cranberry, grape and apple juices Sports drinks like Gatorade Lightly seasoned clear broth or consume(fat free) Sugar, honey syrup  Sample Menu Breakfast                                Lunch                                     Supper Cranberry juice                    Beef broth  Chicken broth Jell-O                                     Grape juice                           Apple juice Coffee or tea                        Jell-O                                      Popsicle                                                Coffee or  tea                        Coffee or tea  _____________________________________________________________________  [facility] - Preparing for Surgery Before surgery, you can play an important role.  Because skin is not sterile, your skin needs to be as free of germs as possible.  You can reduce the number of germs on your skin by washing with CHG (chlorahexidine gluconate) soap before surgery.  CHG is an antiseptic cleaner which kills germs and bonds with the skin to continue killing germs even after washing. Please DO NOT use if you have an allergy to CHG or antibacterial soaps.  If your skin becomes reddened/irritated stop using the CHG and inform your nurse when you arrive at Short Stay. Do not shave (including legs and underarms) for at least 48 hours prior to the first CHG shower.  You may shave your face/neck. Please follow these instructions carefully:  1.  Shower with CHG Soap the night before surgery and the  morning of Surgery.  2.  If you choose to wash your hair, wash your hair first as usual with your  normal  shampoo.  3.  After you shampoo, rinse your hair and body thoroughly to remove the  shampoo.                           4.  Use CHG as you would any other liquid soap.  You can apply chg directly  to the skin and wash                       Gently with a scrungie or clean washcloth.  5.  Apply the CHG Soap to your body ONLY FROM THE NECK DOWN.   Do not use on face/ open                           Wound or open sores. Avoid contact with eyes, ears mouth and genitals (private parts).                       Wash face,  Genitals (private parts) with your normal soap.             6.  Wash thoroughly, paying special attention to the area  where your surgery  will be performed.  7.  Thoroughly rinse your body with warm water from the neck down.  8.  DO NOT shower/wash with your normal soap after using and rinsing off  the CHG Soap.                9.  Pat yourself dry with a clean towel.             10.  Wear clean pajamas.            11.  Place clean sheets on your bed the night of your first shower and do not  sleep with pets. Day of Surgery : Do not apply any lotions/deodorants the morning of surgery.  Please wear clean clothes to the hospital/surgery center.  FAILURE TO FOLLOW THESE INSTRUCTIONS MAY RESULT IN THE CANCELLATION OF YOUR SURGERY PATIENT SIGNATURE_________________________________  NURSE SIGNATURE__________________________________  ________________________________________________________________________  WHAT IS A BLOOD TRANSFUSION? Blood Transfusion Information  A transfusion is the replacement of blood or some of its parts. Blood is made up of multiple cells which provide different functions.  Red blood cells carry oxygen and are used for blood loss replacement.  White blood cells fight against infection.  Platelets control bleeding.  Plasma helps clot blood.  Other blood products are available for specialized needs, such as hemophilia or other clotting disorders. BEFORE THE TRANSFUSION  Who gives blood for transfusions?   Healthy volunteers who are fully evaluated to make sure their blood is safe. This is blood bank blood. Transfusion therapy is the safest it has ever been in the practice of medicine. Before blood is taken from a donor, a complete history is taken to make sure that person has no history of diseases nor engages in risky social behavior (examples are intravenous drug use or sexual activity with multiple partners). The donor's travel history is screened to minimize risk of transmitting infections, such as malaria. The donated blood is tested for signs of infectious diseases, such as HIV and hepatitis. The blood is then tested to be sure it is compatible with you in order to minimize the chance of a transfusion reaction. If you or a relative donates blood, this is often done in anticipation of surgery and is not appropriate for emergency  situations. It takes many days to process the donated blood. RISKS AND COMPLICATIONS Although transfusion therapy is very safe and saves many lives, the main dangers of transfusion include:   Getting an infectious disease.  Developing a transfusion reaction. This is an allergic reaction to something in the blood you were given. Every precaution is taken to prevent this. The decision to have a blood transfusion has been considered carefully by your caregiver before blood is given. Blood is not given unless the benefits outweigh the risks. AFTER THE TRANSFUSION  Right after receiving a blood transfusion, you will usually feel much better and more energetic. This is especially true if your red blood cells have gotten low (anemic). The transfusion raises the level of the red blood cells which carry oxygen, and this usually causes an energy increase.  The nurse administering the transfusion will monitor you carefully for complications. HOME CARE INSTRUCTIONS  No special instructions are needed after a transfusion. You may find your energy is better. Speak with your caregiver about any limitations on activity for underlying diseases you may have. SEEK MEDICAL CARE IF:   Your condition is not improving after your transfusion.  You develop redness or irritation at the intravenous (  IV) site. SEEK IMMEDIATE MEDICAL CARE IF:  Any of the following symptoms occur over the next 12 hours:  Shaking chills.  You have a temperature by mouth above 102 F (38.9 C), not controlled by medicine.  Chest, back, or muscle pain.  People around you feel you are not acting correctly or are confused.  Shortness of breath or difficulty breathing.  Dizziness and fainting.  You get a rash or develop hives.  You have a decrease in urine output.  Your urine turns a dark color or changes to pink, red, or brown. Any of the following symptoms occur over the next 10 days:  You have a temperature by mouth above  102 F (38.9 C), not controlled by medicine.  Shortness of breath.  Weakness after normal activity.  The white part of the eye turns yellow (jaundice).  You have a decrease in the amount of urine or are urinating less often.  Your urine turns a dark color or changes to pink, red, or brown. Document Released: 05/17/2000 Document Revised: 08/12/2011 Document Reviewed: 01/04/2008 ExitCare Patient Information 2014 [place].  _______________________________________________________________________  Incentive Spirometer  An incentive spirometer is a tool that can help keep your lungs clear and active. This tool measures how well you are filling your lungs with each breath. Taking long deep breaths may help reverse or decrease the chance of developing breathing (pulmonary) problems (especially infection) following:  A long period of time when you are unable to move or be active. BEFORE THE PROCEDURE   If the spirometer includes an indicator to show your best effort, your nurse or respiratory therapist will set it to a desired goal.  If possible, sit up straight or lean slightly forward. Try not to slouch.  Hold the incentive spirometer in an upright position. INSTRUCTIONS FOR USE  1. Sit on the edge of your bed if possible, or sit up as far as you can in bed or on a chair. 2. Hold the incentive spirometer in an upright position. 3. Breathe out normally. 4. Place the mouthpiece in your mouth and seal your lips tightly around it. 5. Breathe in slowly and as deeply as possible, raising the piston or the ball toward the top of the column. 6. Hold your breath for 3-5 seconds or for as long as possible. Allow the piston or ball to fall to the bottom of the column. 7. Remove the mouthpiece from your mouth and breathe out normally. 8. Rest for a few seconds and repeat Steps 1 through 7 at least 10 times every 1-2 hours when you are awake. Take your time and take a few normal breaths  between deep breaths. 9. The spirometer may include an indicator to show your best effort. Use the indicator as a goal to work toward during each repetition. 10. After each set of 10 deep breaths, practice coughing to be sure your lungs are clear. If you have an incision (the cut made at the time of surgery), support your incision when coughing by placing a pillow or rolled up towels firmly against it. Once you are able to get out of bed, walk around indoors and cough well. You may stop using the incentive spirometer when instructed by your caregiver.  RISKS AND COMPLICATIONS  Take your time so you do not get dizzy or light-headed.  If you are in pain, you may need to take or ask for pain medication before doing incentive spirometry. It is harder to take a deep breath if you  are having pain. AFTER USE  Rest and breathe slowly and easily.  It can be helpful to keep track of a log of your progress. Your caregiver can provide you with a simple table to help with this. If you are using the spirometer at home, follow these instructions: [place] IF:   You are having difficultly using the spirometer.  You have trouble using the spirometer as often as instructed.  Your pain medication is not giving enough relief while using the spirometer.  You develop fever of 100.5 F (38.1 C) or higher. SEEK IMMEDIATE MEDICAL CARE IF:   You cough up bloody sputum that had not been present before.  You develop fever of 102 F (38.9 C) or greater.  You develop worsening pain at or near the incision site. MAKE SURE YOU:   Understand these instructions.  Will watch your condition.  Will get help right away if you are not doing well or get worse. Document Released: 09/30/2006 Document Revised: 08/12/2011 Document Reviewed: 12/01/2006 ExitCare Patient Information 2014 ExitCare, [place].   ________________________________________________________________________    Please read over the  following fact sheets that you were given: MRSA Information, coughing and deep breathing exercises, leg exercises

## 2014-01-11 ENCOUNTER — Encounter (HOSPITAL_COMMUNITY): Payer: Self-pay

## 2014-01-11 ENCOUNTER — Encounter (HOSPITAL_COMMUNITY): Payer: Self-pay | Admitting: Pharmacy Technician

## 2014-01-11 ENCOUNTER — Ambulatory Visit (HOSPITAL_COMMUNITY)
Admission: RE | Admit: 2014-01-11 | Discharge: 2014-01-11 | Disposition: A | Discharge status: Home / Self Care | Payer: Medicare Other | Source: Ambulatory Visit | Attending: Orthopedic Surgery | Admitting: Orthopedic Surgery

## 2014-01-11 ENCOUNTER — Encounter (HOSPITAL_COMMUNITY)
Admission: RE | Admit: 2014-01-11 | Discharge: 2014-01-11 | Disposition: A | Discharge status: Home / Self Care | Payer: Medicare Other | Source: Ambulatory Visit | Attending: Orthopedic Surgery | Admitting: Orthopedic Surgery

## 2014-01-11 ENCOUNTER — Encounter (INDEPENDENT_AMBULATORY_CARE_PROVIDER_SITE_OTHER): Payer: Self-pay

## 2014-01-11 DIAGNOSIS — J984 Other disorders of lung: Secondary | ICD-10-CM | POA: Insufficient documentation

## 2014-01-11 DIAGNOSIS — Z01812 Encounter for preprocedural laboratory examination: Secondary | ICD-10-CM | POA: Diagnosis not present

## 2014-01-11 DIAGNOSIS — Z01818 Encounter for other preprocedural examination: Secondary | ICD-10-CM | POA: Insufficient documentation

## 2014-01-11 DIAGNOSIS — I10 Essential (primary) hypertension: Secondary | ICD-10-CM | POA: Diagnosis not present

## 2014-01-11 DIAGNOSIS — I517 Cardiomegaly: Secondary | ICD-10-CM | POA: Insufficient documentation

## 2014-01-11 HISTORY — DX: Hypothyroidism, unspecified: E03.9

## 2014-01-11 HISTORY — DX: Depression, unspecified: F32.A

## 2014-01-11 HISTORY — DX: Major depressive disorder, single episode, unspecified: F32.9

## 2014-01-11 HISTORY — DX: Family history of other specified conditions: Z84.89

## 2014-01-11 HISTORY — DX: Shortness of breath: R06.02

## 2014-01-11 HISTORY — DX: Anemia, unspecified: D64.9

## 2014-01-11 HISTORY — DX: Gastro-esophageal reflux disease without esophagitis: K21.9

## 2014-01-11 HISTORY — DX: Unspecified osteoarthritis, unspecified site: M19.90

## 2014-01-11 LAB — PROTIME-INR
INR: 1.07 (ref 0.00–1.49)
Prothrombin Time: 13.9 seconds (ref 11.6–15.2)

## 2014-01-11 LAB — URINE MICROSCOPIC-ADD ON

## 2014-01-11 LAB — URINALYSIS, ROUTINE W REFLEX MICROSCOPIC
Bilirubin Urine: NEGATIVE
GLUCOSE, UA: NEGATIVE mg/dL
Ketones, ur: NEGATIVE mg/dL
Nitrite: NEGATIVE
Protein, ur: NEGATIVE mg/dL
Specific Gravity, Urine: 1.015 (ref 1.005–1.030)
Urobilinogen, UA: 0.2 mg/dL (ref 0.0–1.0)
pH: 6.5 (ref 5.0–8.0)

## 2014-01-11 LAB — BASIC METABOLIC PANEL
ANION GAP: 11 (ref 5–15)
BUN: 16 mg/dL (ref 6–23)
CHLORIDE: 100 meq/L (ref 96–112)
CO2: 29 mEq/L (ref 19–32)
CREATININE: 1.02 mg/dL (ref 0.50–1.10)
Calcium: 9.6 mg/dL (ref 8.4–10.5)
GFR, EST AFRICAN AMERICAN: 64 mL/min — AB (ref >90)
GFR, EST NON AFRICAN AMERICAN: 55 mL/min — AB (ref >90)
Glucose, Bld: 119 mg/dL — ABNORMAL HIGH (ref 70–99)
Potassium: 3.7 mEq/L (ref 3.7–5.3)
Sodium: 140 mEq/L (ref 137–147)

## 2014-01-11 LAB — CBC
HCT: 37.9 % (ref 36.0–46.0)
HEMOGLOBIN: 12.5 g/dL (ref 12.0–15.0)
MCH: 30.2 pg (ref 26.0–34.0)
MCHC: 33 g/dL (ref 30.0–36.0)
MCV: 91.5 fL (ref 78.0–100.0)
Platelets: 217 10*3/uL (ref 150–400)
RBC: 4.14 MIL/uL (ref 3.87–5.11)
RDW: 13.6 % (ref 11.5–15.5)
WBC: 7.5 10*3/uL (ref 4.0–10.5)

## 2014-01-11 LAB — ABO/RH: ABO/RH(D): A POS

## 2014-01-11 LAB — APTT: APTT: 31 s (ref 24–37)

## 2014-01-11 LAB — SURGICAL PCR SCREEN
MRSA, PCR: NEGATIVE
Staphylococcus aureus: NEGATIVE

## 2014-01-11 NOTE — Progress Notes (Signed)
Urinalysis and micro results faxed via EPIC to Dr Alvan Dame.

## 2014-01-11 NOTE — Progress Notes (Signed)
Clearance Dr Laqueta Due 12/28/13 on chart  EKG- 12/28/13 on chart  LOV Dr Laqueta Due 12/28/13 on chart

## 2014-01-11 NOTE — Progress Notes (Signed)
2V CXR faxed via EPIC to Dr Alvan Dame.

## 2014-01-12 DIAGNOSIS — M79609 Pain in unspecified limb: Secondary | ICD-10-CM | POA: Diagnosis not present

## 2014-01-17 ENCOUNTER — Encounter (HOSPITAL_COMMUNITY)
Admission: RE | Disposition: A | Discharge status: Skilled Nursing Facility | Payer: Self-pay | Source: Ambulatory Visit | Attending: Orthopedic Surgery

## 2014-01-17 ENCOUNTER — Inpatient Hospital Stay (HOSPITAL_COMMUNITY)
Admission: RE | Admit: 2014-01-17 | Discharge: 2014-01-19 | DRG: 470 | Disposition: A | Discharge status: Skilled Nursing Facility | Payer: Medicare Other | Source: Ambulatory Visit | Attending: Orthopedic Surgery | Admitting: Orthopedic Surgery

## 2014-01-17 ENCOUNTER — Encounter (HOSPITAL_COMMUNITY): Payer: Self-pay | Admitting: *Deleted

## 2014-01-17 ENCOUNTER — Encounter (HOSPITAL_COMMUNITY): Payer: Medicare Other | Admitting: Anesthesiology

## 2014-01-17 ENCOUNTER — Inpatient Hospital Stay (HOSPITAL_COMMUNITY): Payer: Medicare Other | Admitting: Anesthesiology

## 2014-01-17 DIAGNOSIS — Z6841 Body Mass Index (BMI) 40.0 and over, adult: Secondary | ICD-10-CM

## 2014-01-17 DIAGNOSIS — M84469D Pathological fracture, unspecified tibia and fibula, subsequent encounter for fracture with routine healing: Secondary | ICD-10-CM | POA: Diagnosis not present

## 2014-01-17 DIAGNOSIS — Z853 Personal history of malignant neoplasm of breast: Secondary | ICD-10-CM | POA: Diagnosis not present

## 2014-01-17 DIAGNOSIS — E039 Hypothyroidism, unspecified: Secondary | ICD-10-CM | POA: Diagnosis present

## 2014-01-17 DIAGNOSIS — G8918 Other acute postprocedural pain: Secondary | ICD-10-CM | POA: Diagnosis not present

## 2014-01-17 DIAGNOSIS — Z96659 Presence of unspecified artificial knee joint: Secondary | ICD-10-CM

## 2014-01-17 DIAGNOSIS — C50119 Malignant neoplasm of central portion of unspecified female breast: Secondary | ICD-10-CM | POA: Diagnosis not present

## 2014-01-17 DIAGNOSIS — M129 Arthropathy, unspecified: Secondary | ICD-10-CM | POA: Diagnosis not present

## 2014-01-17 DIAGNOSIS — I10 Essential (primary) hypertension: Secondary | ICD-10-CM | POA: Diagnosis present

## 2014-01-17 DIAGNOSIS — M810 Age-related osteoporosis without current pathological fracture: Secondary | ICD-10-CM | POA: Diagnosis not present

## 2014-01-17 DIAGNOSIS — IMO0002 Reserved for concepts with insufficient information to code with codable children: Secondary | ICD-10-CM | POA: Diagnosis not present

## 2014-01-17 DIAGNOSIS — F3289 Other specified depressive episodes: Secondary | ICD-10-CM | POA: Diagnosis present

## 2014-01-17 DIAGNOSIS — J45909 Unspecified asthma, uncomplicated: Secondary | ICD-10-CM | POA: Diagnosis present

## 2014-01-17 DIAGNOSIS — R262 Difficulty in walking, not elsewhere classified: Secondary | ICD-10-CM | POA: Diagnosis not present

## 2014-01-17 DIAGNOSIS — E785 Hyperlipidemia, unspecified: Secondary | ICD-10-CM | POA: Diagnosis present

## 2014-01-17 DIAGNOSIS — Z96652 Presence of left artificial knee joint: Secondary | ICD-10-CM

## 2014-01-17 DIAGNOSIS — F329 Major depressive disorder, single episode, unspecified: Secondary | ICD-10-CM | POA: Diagnosis present

## 2014-01-17 DIAGNOSIS — M25569 Pain in unspecified knee: Secondary | ICD-10-CM | POA: Diagnosis not present

## 2014-01-17 DIAGNOSIS — K2100 Gastro-esophageal reflux disease with esophagitis, without bleeding: Secondary | ICD-10-CM | POA: Diagnosis not present

## 2014-01-17 DIAGNOSIS — Z8249 Family history of ischemic heart disease and other diseases of the circulatory system: Secondary | ICD-10-CM

## 2014-01-17 DIAGNOSIS — K219 Gastro-esophageal reflux disease without esophagitis: Secondary | ICD-10-CM | POA: Diagnosis present

## 2014-01-17 DIAGNOSIS — D649 Anemia, unspecified: Secondary | ICD-10-CM | POA: Diagnosis not present

## 2014-01-17 DIAGNOSIS — R0602 Shortness of breath: Secondary | ICD-10-CM | POA: Diagnosis not present

## 2014-01-17 DIAGNOSIS — M1710 Unilateral primary osteoarthritis, unspecified knee: Secondary | ICD-10-CM | POA: Diagnosis present

## 2014-01-17 DIAGNOSIS — D62 Acute posthemorrhagic anemia: Secondary | ICD-10-CM | POA: Diagnosis not present

## 2014-01-17 DIAGNOSIS — I130 Hypertensive heart and chronic kidney disease with heart failure and stage 1 through stage 4 chronic kidney disease, or unspecified chronic kidney disease: Secondary | ICD-10-CM | POA: Diagnosis not present

## 2014-01-17 HISTORY — DX: Presence of unspecified artificial knee joint: Z96.659

## 2014-01-17 HISTORY — PX: TOTAL KNEE ARTHROPLASTY: SHX125

## 2014-01-17 LAB — TYPE AND SCREEN
ABO/RH(D): A POS
Antibody Screen: NEGATIVE

## 2014-01-17 SURGERY — ARTHROPLASTY, KNEE, TOTAL
Anesthesia: Spinal | Site: Knee | Laterality: Left

## 2014-01-17 MED ORDER — CEFAZOLIN SODIUM-DEXTROSE 2-3 GM-% IV SOLR
2.0000 g | Freq: Four times a day (QID) | INTRAVENOUS | Status: AC
  Administered 2014-01-17 – 2014-01-18 (×2): 2 g via INTRAVENOUS
  Filled 2014-01-17 (×2): qty 50

## 2014-01-17 MED ORDER — LEVOTHYROXINE SODIUM 100 MCG PO TABS
100.0000 ug | ORAL_TABLET | Freq: Every day | ORAL | Status: DC
  Administered 2014-01-18 – 2014-01-19 (×2): 100 ug via ORAL
  Filled 2014-01-17 (×3): qty 1

## 2014-01-17 MED ORDER — MIDAZOLAM HCL 5 MG/5ML IJ SOLN
INTRAMUSCULAR | Status: DC | PRN
  Administered 2014-01-17: 2 mg via INTRAVENOUS

## 2014-01-17 MED ORDER — TORSEMIDE 10 MG PO TABS
10.0000 mg | ORAL_TABLET | Freq: Every day | ORAL | Status: DC
  Administered 2014-01-19: 10 mg via ORAL
  Filled 2014-01-17 (×3): qty 1

## 2014-01-17 MED ORDER — CHLORHEXIDINE GLUCONATE 4 % EX LIQD: 60.0000 mL | Freq: Once | CUTANEOUS | Status: DC

## 2014-01-17 MED ORDER — METOCLOPRAMIDE HCL 10 MG PO TABS: 5.0000 mg | ORAL_TABLET | Freq: Three times a day (TID) | ORAL | Status: DC | PRN

## 2014-01-17 MED ORDER — DOCUSATE SODIUM 100 MG PO CAPS
100.0000 mg | ORAL_CAPSULE | Freq: Two times a day (BID) | ORAL | Status: DC
  Administered 2014-01-17 – 2014-01-19 (×4): 100 mg via ORAL

## 2014-01-17 MED ORDER — BUPIVACAINE HCL (PF) 0.75 % IJ SOLN
INTRAMUSCULAR | Status: DC | PRN
  Administered 2014-01-17: 15 mg via INTRATHECAL

## 2014-01-17 MED ORDER — MAGNESIUM CITRATE PO SOLN: 1.0000 | Freq: Once | ORAL | Status: AC | PRN

## 2014-01-17 MED ORDER — POLYETHYLENE GLYCOL 3350 17 G PO PACK
17.0000 g | PACK | Freq: Two times a day (BID) | ORAL | Status: DC
  Administered 2014-01-17 – 2014-01-19 (×4): 17 g via ORAL

## 2014-01-17 MED ORDER — PROPOFOL 10 MG/ML IV BOLUS
INTRAVENOUS | Status: AC
  Filled 2014-01-17: qty 20

## 2014-01-17 MED ORDER — MEPERIDINE HCL 50 MG/ML IJ SOLN: 6.2500 mg | INTRAMUSCULAR | Status: DC | PRN

## 2014-01-17 MED ORDER — ASPIRIN EC 325 MG PO TBEC
325.0000 mg | DELAYED_RELEASE_TABLET | Freq: Two times a day (BID) | ORAL | Status: DC
  Administered 2014-01-18 – 2014-01-19 (×3): 325 mg via ORAL
  Filled 2014-01-17 (×5): qty 1

## 2014-01-17 MED ORDER — METHOCARBAMOL 500 MG PO TABS
500.0000 mg | ORAL_TABLET | Freq: Four times a day (QID) | ORAL | Status: DC | PRN
  Administered 2014-01-17 – 2014-01-18 (×4): 500 mg via ORAL
  Filled 2014-01-17 (×4): qty 1

## 2014-01-17 MED ORDER — PANTOPRAZOLE SODIUM 40 MG PO TBEC
40.0000 mg | DELAYED_RELEASE_TABLET | Freq: Two times a day (BID) | ORAL | Status: DC
  Administered 2014-01-17 – 2014-01-19 (×4): 40 mg via ORAL
  Filled 2014-01-17 (×5): qty 1

## 2014-01-17 MED ORDER — TORSEMIDE 10 MG PO TABS
10.0000 mg | ORAL_TABLET | Freq: Every day | ORAL | Status: DC | PRN
  Filled 2014-01-17: qty 1

## 2014-01-17 MED ORDER — KETOROLAC TROMETHAMINE 30 MG/ML IJ SOLN
INTRAMUSCULAR | Status: AC
  Filled 2014-01-17: qty 1

## 2014-01-17 MED ORDER — ALUM & MAG HYDROXIDE-SIMETH 200-200-20 MG/5ML PO SUSP
30.0000 mL | ORAL | Status: DC | PRN
Start: 2014-01-17 — End: 2014-01-19

## 2014-01-17 MED ORDER — BISACODYL 10 MG RE SUPP: 10.0000 mg | Freq: Every day | RECTAL | Status: DC | PRN

## 2014-01-17 MED ORDER — FERROUS SULFATE 325 (65 FE) MG PO TABS
325.0000 mg | ORAL_TABLET | Freq: Three times a day (TID) | ORAL | Status: DC
  Administered 2014-01-18 – 2014-01-19 (×4): 325 mg via ORAL
  Filled 2014-01-17 (×8): qty 1

## 2014-01-17 MED ORDER — SODIUM CHLORIDE 0.9 % IJ SOLN
INTRAMUSCULAR | Status: AC
  Filled 2014-01-17: qty 10

## 2014-01-17 MED ORDER — KETOROLAC TROMETHAMINE 30 MG/ML IJ SOLN
INTRAMUSCULAR | Status: DC | PRN
  Administered 2014-01-17: 30 mg

## 2014-01-17 MED ORDER — LOSARTAN POTASSIUM 50 MG PO TABS
100.0000 mg | ORAL_TABLET | Freq: Every day | ORAL | Status: DC
  Administered 2014-01-17 – 2014-01-18 (×2): 100 mg via ORAL
  Filled 2014-01-17 (×3): qty 2

## 2014-01-17 MED ORDER — FENTANYL CITRATE 0.05 MG/ML IJ SOLN: 25.0000 ug | INTRAMUSCULAR | Status: DC | PRN

## 2014-01-17 MED ORDER — TRAZODONE HCL 150 MG PO TABS
150.0000 mg | ORAL_TABLET | Freq: Every day | ORAL | Status: DC
  Administered 2014-01-17 – 2014-01-18 (×2): 150 mg via ORAL
  Filled 2014-01-17 (×3): qty 1

## 2014-01-17 MED ORDER — CARVEDILOL 12.5 MG PO TABS
12.5000 mg | ORAL_TABLET | Freq: Two times a day (BID) | ORAL | Status: DC
  Administered 2014-01-17 – 2014-01-19 (×4): 12.5 mg via ORAL
  Filled 2014-01-17 (×6): qty 1

## 2014-01-17 MED ORDER — LACTATED RINGERS IV SOLN
INTRAVENOUS | Status: DC
  Administered 2014-01-17: 15:00:00 via INTRAVENOUS
  Administered 2014-01-17: 1000 mL via INTRAVENOUS

## 2014-01-17 MED ORDER — LORATADINE 10 MG PO TABS
10.0000 mg | ORAL_TABLET | Freq: Every day | ORAL | Status: DC
  Administered 2014-01-18 – 2014-01-19 (×2): 10 mg via ORAL
  Filled 2014-01-17 (×2): qty 1

## 2014-01-17 MED ORDER — SODIUM CHLORIDE 0.9 % IJ SOLN
INTRAMUSCULAR | Status: DC | PRN
  Administered 2014-01-17: 9 mL

## 2014-01-17 MED ORDER — BUPIVACAINE LIPOSOME 1.3 % IJ SUSP
20.0000 mL | Freq: Once | INTRAMUSCULAR | Status: AC
  Administered 2014-01-17: 20 mL
  Filled 2014-01-17: qty 20

## 2014-01-17 MED ORDER — DEXTROSE 5 % IV SOLN
3.0000 g | INTRAVENOUS | Status: AC
  Administered 2014-01-17: 3 g via INTRAVENOUS
  Filled 2014-01-17 (×2): qty 3000

## 2014-01-17 MED ORDER — SERTRALINE HCL 50 MG PO TABS
50.0000 mg | ORAL_TABLET | Freq: Every morning | ORAL | Status: DC
  Administered 2014-01-18 – 2014-01-19 (×2): 50 mg via ORAL
  Filled 2014-01-17 (×2): qty 1

## 2014-01-17 MED ORDER — TRANEXAMIC ACID 100 MG/ML IV SOLN
1000.0000 mg | Freq: Once | INTRAVENOUS | Status: AC
  Administered 2014-01-17: 1000 mg via INTRAVENOUS
  Filled 2014-01-17: qty 10

## 2014-01-17 MED ORDER — HYDROMORPHONE HCL PF 1 MG/ML IJ SOLN: 0.5000 mg | INTRAMUSCULAR | Status: DC | PRN

## 2014-01-17 MED ORDER — FENTANYL CITRATE 0.05 MG/ML IJ SOLN
INTRAMUSCULAR | Status: AC
Start: 2014-01-17 — End: 2014-01-17
  Filled 2014-01-17: qty 2

## 2014-01-17 MED ORDER — DEXAMETHASONE SODIUM PHOSPHATE 10 MG/ML IJ SOLN
10.0000 mg | Freq: Once | INTRAMUSCULAR | Status: AC
  Administered 2014-01-17: 10 mg via INTRAVENOUS

## 2014-01-17 MED ORDER — HYDROCHLOROTHIAZIDE 25 MG PO TABS
25.0000 mg | ORAL_TABLET | Freq: Every morning | ORAL | Status: DC
  Administered 2014-01-18 – 2014-01-19 (×2): 25 mg via ORAL
  Filled 2014-01-17 (×2): qty 1

## 2014-01-17 MED ORDER — METHOCARBAMOL 1000 MG/10ML IJ SOLN
500.0000 mg | Freq: Four times a day (QID) | INTRAMUSCULAR | Status: DC | PRN
  Filled 2014-01-17 (×2): qty 5

## 2014-01-17 MED ORDER — PROPOFOL INFUSION 10 MG/ML OPTIME
INTRAVENOUS | Status: DC | PRN
  Administered 2014-01-17: 70 ug/kg/min via INTRAVENOUS

## 2014-01-17 MED ORDER — MENTHOL 3 MG MT LOZG: 1.0000 | LOZENGE | OROMUCOSAL | Status: DC | PRN

## 2014-01-17 MED ORDER — POTASSIUM CHLORIDE CRYS ER 10 MEQ PO TBCR
10.0000 meq | EXTENDED_RELEASE_TABLET | Freq: Two times a day (BID) | ORAL | Status: DC
  Administered 2014-01-18 – 2014-01-19 (×3): 10 meq via ORAL
  Filled 2014-01-17 (×4): qty 1

## 2014-01-17 MED ORDER — DIPHENHYDRAMINE HCL 25 MG PO CAPS: 25.0000 mg | ORAL_CAPSULE | Freq: Four times a day (QID) | ORAL | Status: DC | PRN

## 2014-01-17 MED ORDER — MIDAZOLAM HCL 2 MG/2ML IJ SOLN
INTRAMUSCULAR | Status: AC
  Filled 2014-01-17: qty 2

## 2014-01-17 MED ORDER — BUPIVACAINE-EPINEPHRINE (PF) 0.25% -1:200000 IJ SOLN
INTRAMUSCULAR | Status: AC
  Filled 2014-01-17: qty 30

## 2014-01-17 MED ORDER — PHENOL 1.4 % MT LIQD: 1.0000 | OROMUCOSAL | Status: DC | PRN

## 2014-01-17 MED ORDER — BUPIVACAINE-EPINEPHRINE (PF) 0.25% -1:200000 IJ SOLN
INTRAMUSCULAR | Status: DC | PRN
  Administered 2014-01-17: 30 mL

## 2014-01-17 MED ORDER — FENTANYL CITRATE 0.05 MG/ML IJ SOLN
INTRAMUSCULAR | Status: DC | PRN
  Administered 2014-01-17: 100 ug via INTRAVENOUS

## 2014-01-17 MED ORDER — PROMETHAZINE HCL 25 MG/ML IJ SOLN: 6.2500 mg | INTRAMUSCULAR | Status: DC | PRN

## 2014-01-17 MED ORDER — SODIUM CHLORIDE 0.9 % IR SOLN
Status: DC | PRN
  Administered 2014-01-17: 1000 mL

## 2014-01-17 MED ORDER — POTASSIUM CHLORIDE 2 MEQ/ML IV SOLN
INTRAVENOUS | Status: DC
  Administered 2014-01-17 – 2014-01-19 (×4): via INTRAVENOUS
  Filled 2014-01-17 (×9): qty 1000

## 2014-01-17 MED ORDER — ONDANSETRON HCL 4 MG/2ML IJ SOLN
INTRAMUSCULAR | Status: DC | PRN
  Administered 2014-01-17: 4 mg via INTRAVENOUS

## 2014-01-17 MED ORDER — METOCLOPRAMIDE HCL 5 MG/ML IJ SOLN: 5.0000 mg | Freq: Three times a day (TID) | INTRAMUSCULAR | Status: DC | PRN

## 2014-01-17 MED ORDER — ALBUTEROL SULFATE (2.5 MG/3ML) 0.083% IN NEBU: 2.5000 mg | INHALATION_SOLUTION | Freq: Four times a day (QID) | RESPIRATORY_TRACT | Status: DC | PRN

## 2014-01-17 MED ORDER — CELECOXIB 200 MG PO CAPS
200.0000 mg | ORAL_CAPSULE | Freq: Two times a day (BID) | ORAL | Status: DC
  Administered 2014-01-17 – 2014-01-19 (×4): 200 mg via ORAL
  Filled 2014-01-17 (×5): qty 1

## 2014-01-17 MED ORDER — ATORVASTATIN CALCIUM 10 MG PO TABS
10.0000 mg | ORAL_TABLET | Freq: Every day | ORAL | Status: DC
  Administered 2014-01-17 – 2014-01-18 (×2): 10 mg via ORAL
  Filled 2014-01-17 (×3): qty 1

## 2014-01-17 MED ORDER — DEXAMETHASONE SODIUM PHOSPHATE 10 MG/ML IJ SOLN
10.0000 mg | Freq: Once | INTRAMUSCULAR | Status: AC
  Administered 2014-01-18: 10 mg via INTRAVENOUS
  Filled 2014-01-17: qty 1

## 2014-01-17 MED ORDER — HYDROCODONE-ACETAMINOPHEN 7.5-325 MG PO TABS
1.0000 | ORAL_TABLET | ORAL | Status: DC
  Administered 2014-01-17: 1 via ORAL
  Administered 2014-01-17 – 2014-01-19 (×10): 2 via ORAL
  Filled 2014-01-17 (×10): qty 2
  Filled 2014-01-17: qty 1

## 2014-01-17 MED ORDER — ONDANSETRON HCL 4 MG/2ML IJ SOLN
INTRAMUSCULAR | Status: AC
  Filled 2014-01-17: qty 2

## 2014-01-17 MED ORDER — ONDANSETRON HCL 4 MG PO TABS
4.0000 mg | ORAL_TABLET | Freq: Four times a day (QID) | ORAL | Status: DC | PRN
Start: 2014-01-17 — End: 2014-01-19

## 2014-01-17 MED ORDER — ALBUTEROL SULFATE HFA 108 (90 BASE) MCG/ACT IN AERS: 1.0000 | INHALATION_SPRAY | Freq: Four times a day (QID) | RESPIRATORY_TRACT | Status: DC | PRN

## 2014-01-17 MED ORDER — ONDANSETRON HCL 4 MG/2ML IJ SOLN
4.0000 mg | Freq: Four times a day (QID) | INTRAMUSCULAR | Status: DC | PRN
Start: 2014-01-17 — End: 2014-01-19

## 2014-01-17 MED ORDER — DEXAMETHASONE SODIUM PHOSPHATE 10 MG/ML IJ SOLN
INTRAMUSCULAR | Status: AC
  Filled 2014-01-17: qty 1

## 2014-01-17 SURGICAL SUPPLY — 52 items
BAG ZIPLOCK 12X15 (MISCELLANEOUS) IMPLANT
BANDAGE ELASTIC 6 VELCRO ST LF (GAUZE/BANDAGES/DRESSINGS) ×3 IMPLANT
BANDAGE ESMARK 6X9 LF (GAUZE/BANDAGES/DRESSINGS) ×1 IMPLANT
BLADE SAW SGTL 13.0X1.19X90.0M (BLADE) ×3 IMPLANT
BNDG ESMARK 6X9 LF (GAUZE/BANDAGES/DRESSINGS) ×3
BOWL SMART MIX CTS (DISPOSABLE) ×3 IMPLANT
CAPT RP KNEE ×3 IMPLANT
CEMENT HV SMART SET (Cement) ×6 IMPLANT
CUFF TOURN SGL QUICK 34 (TOURNIQUET CUFF) ×2
CUFF TRNQT CYL 34X4X40X1 (TOURNIQUET CUFF) ×1 IMPLANT
DERMABOND ADVANCED (GAUZE/BANDAGES/DRESSINGS) ×2
DERMABOND ADVANCED .7 DNX12 (GAUZE/BANDAGES/DRESSINGS) ×1 IMPLANT
DRAPE EXTREMITY T 121X128X90 (DRAPE) ×3 IMPLANT
DRAPE POUCH INSTRU U-SHP 10X18 (DRAPES) ×3 IMPLANT
DRAPE U-SHAPE 47X51 STRL (DRAPES) ×3 IMPLANT
DRSG AQUACEL AG ADV 3.5X10 (GAUZE/BANDAGES/DRESSINGS) ×3 IMPLANT
DURAPREP 26ML APPLICATOR (WOUND CARE) ×6 IMPLANT
ELECT REM PT RETURN 9FT ADLT (ELECTROSURGICAL) ×3
ELECTRODE REM PT RTRN 9FT ADLT (ELECTROSURGICAL) ×1 IMPLANT
FACESHIELD WRAPAROUND (MASK) ×15 IMPLANT
GLOVE BIOGEL PI IND STRL 7.5 (GLOVE) ×1 IMPLANT
GLOVE BIOGEL PI IND STRL 8 (GLOVE) ×1 IMPLANT
GLOVE BIOGEL PI INDICATOR 7.5 (GLOVE) ×2
GLOVE BIOGEL PI INDICATOR 8 (GLOVE) ×2
GLOVE ECLIPSE 8.0 STRL XLNG CF (GLOVE) IMPLANT
GLOVE ORTHO TXT STRL SZ7.5 (GLOVE) IMPLANT
GLOVE SURG SS PI 7.5 STRL IVOR (GLOVE) ×6 IMPLANT
GLOVE SURG SS PI 8.0 STRL IVOR (GLOVE) ×6 IMPLANT
GOWN SPEC L3 XXLG W/TWL (GOWN DISPOSABLE) ×3 IMPLANT
GOWN STRL REUS W/TWL LRG LVL3 (GOWN DISPOSABLE) ×3 IMPLANT
HANDPIECE INTERPULSE COAX TIP (DISPOSABLE) ×2
KIT BASIN OR (CUSTOM PROCEDURE TRAY) ×3 IMPLANT
MANIFOLD NEPTUNE II (INSTRUMENTS) ×3 IMPLANT
NDL SAFETY ECLIPSE 18X1.5 (NEEDLE) ×1 IMPLANT
NEEDLE HYPO 18GX1.5 SHARP (NEEDLE) ×2
PACK TOTAL JOINT (CUSTOM PROCEDURE TRAY) ×3 IMPLANT
POSITIONER SURGICAL ARM (MISCELLANEOUS) ×3 IMPLANT
SET HNDPC FAN SPRY TIP SCT (DISPOSABLE) ×1 IMPLANT
SET PAD KNEE POSITIONER (MISCELLANEOUS) ×3 IMPLANT
SUCTION FRAZIER 12FR DISP (SUCTIONS) ×3 IMPLANT
SUT MNCRL AB 4-0 PS2 18 (SUTURE) ×3 IMPLANT
SUT VIC AB 1 CT1 36 (SUTURE) ×3 IMPLANT
SUT VIC AB 2-0 CT1 27 (SUTURE) ×6
SUT VIC AB 2-0 CT1 TAPERPNT 27 (SUTURE) ×3 IMPLANT
SUT VLOC 180 0 24IN GS25 (SUTURE) ×3 IMPLANT
SYRINGE 60CC LL (MISCELLANEOUS) ×3 IMPLANT
TOWEL OR 17X26 10 PK STRL BLUE (TOWEL DISPOSABLE) ×3 IMPLANT
TOWEL OR NON WOVEN STRL DISP B (DISPOSABLE) IMPLANT
TRAY FOLEY BAG SILVER LF 14FR (CATHETERS) ×3 IMPLANT
TRAY FOLEY CATH 14FRSI W/METER (CATHETERS) IMPLANT
WATER STERILE IRR 1500ML POUR (IV SOLUTION) ×3 IMPLANT
WRAP KNEE MAXI GEL POST OP (GAUZE/BANDAGES/DRESSINGS) ×3 IMPLANT

## 2014-01-17 NOTE — Transfer of Care (Signed)
Immediate Anesthesia Transfer of Care Note  Patient: Katelyn Watson  Procedure(s) Performed: Procedure(s): LEFT TOTAL KNEE ARTHROPLASTY (Left)  Patient Location: PACU  Anesthesia Type:Spinal  Level of Consciousness: sedated  Airway & Oxygen Therapy: Patient Spontanous Breathing and Patient connected to face mask oxygen  Post-op Assessment: Report given to PACU RN and Post -op Vital signs reviewed and stable  Post vital signs: Reviewed and stable  Complications: No apparent anesthesia complications

## 2014-01-17 NOTE — Anesthesia Postprocedure Evaluation (Signed)
  Anesthesia Post-op Note  Patient: Katelyn Watson  Procedure(s) Performed: Procedure(s) (LRB): LEFT TOTAL KNEE ARTHROPLASTY (Left)  Patient Location: PACU  Anesthesia Type: Spinal  Level of Consciousness: awake and alert   Airway and Oxygen Therapy: Patient Spontanous Breathing  Post-op Pain: mild  Post-op Assessment: Post-op Vital signs reviewed, Patient's Cardiovascular Status Stable, Respiratory Function Stable, Patent Airway and No signs of Nausea or vomiting  Last Vitals:  Filed Vitals:   01/17/14 1643  BP: 130/80  Pulse:   Temp: 36.8 C  Resp: 16    Post-op Vital Signs: stable   Complications: No apparent anesthesia complications

## 2014-01-17 NOTE — Anesthesia Preprocedure Evaluation (Signed)
Anesthesia Evaluation  Patient identified by MRN, date of birth, ID band Patient awake    Reviewed: Allergy & Precautions, H&P , NPO status , Patient's Chart, lab work & pertinent test results  Airway Mallampati: II TM Distance: >3 FB Neck ROM: Full    Dental no notable dental hx.    Pulmonary neg pulmonary ROS, asthma ,  breath sounds clear to auscultation  Pulmonary exam normal       Cardiovascular hypertension, Pt. on medications Rhythm:Regular Rate:Normal     Neuro/Psych negative neurological ROS  negative psych ROS   GI/Hepatic negative GI ROS, Neg liver ROS,   Endo/Other  Morbid obesity  Renal/GU negative Renal ROS  negative genitourinary   Musculoskeletal negative musculoskeletal ROS (+)   Abdominal   Peds negative pediatric ROS (+)  Hematology negative hematology ROS (+)   Anesthesia Other Findings   Reproductive/Obstetrics negative OB ROS                           Anesthesia Physical Anesthesia Plan  ASA: III  Anesthesia Plan: Spinal   Post-op Pain Management:    Induction:   Airway Management Planned: Simple Face Mask  Additional Equipment:   Intra-op Plan:   Post-operative Plan:   Informed Consent: I have reviewed the patients History and Physical, chart, labs and discussed the procedure including the risks, benefits and alternatives for the proposed anesthesia with the patient or authorized representative who has indicated his/her understanding and acceptance.   Dental advisory given  Plan Discussed with: CRNA  Anesthesia Plan Comments:         Anesthesia Quick Evaluation

## 2014-01-17 NOTE — Op Note (Signed)
NAME:  Katelyn Watson                      MEDICAL RECORD NO.:  [id number]                             FACILITY:  [facility]      PHYSICIAN:  Pietro Cassis. Alvan Dame, M.D.  DATE OF BIRTH:  03/08/1946      DATE OF PROCEDURE:  01/17/2014                                     OPERATIVE REPORT         PREOPERATIVE DIAGNOSIS:  Left knee osteoarthritis.      POSTOPERATIVE DIAGNOSIS:  Left knee osteoarthritis.      FINDINGS:  The patient was noted to have complete loss of cartilage and   bone-on-bone arthritis with associated osteophytes in the medial and patellofemoral compartments of   the knee with a significant synovitis and associated effusion.      PROCEDURE:  Left total knee replacement.      COMPONENTS USED:  DePuy rotating platform posterior stabilized knee   system, a size 3 femur, 2.5 tibia, 12.5 mm PS insert, and 38 patellar   button.      SURGEON:  Pietro Cassis. Alvan Dame, M.D.      ASSISTANT:  Danae Orleans, PA-C.      ANESTHESIA:  Spinal.      SPECIMENS:  None.      COMPLICATION:  None.      DRAINS:  One Hemovac.  EBL: <100cc      TOURNIQUET TIME:   Total Tourniquet Time Documented: Thigh (Left) - 38 minutes Total: Thigh (Left) - 38 minutes     The patient was stable to the recovery room.      INDICATION FOR PROCEDURE:  Katelyn Watson is a 68 y.o. female patient of   mine.  The patient had been seen, evaluated, and treated conservatively in the   office with medication, activity modification, and injections.  The patient had   radiographic changes of bone-on-bone arthritis with endplate sclerosis and osteophytes noted.      The patient failed conservative measures including medication, injections, and activity modification, and at this point was ready for more definitive measures.   Based on the radiographic changes and failed conservative measures, the patient   decided to proceed with total knee replacement.  Risks of infection,   DVT, component failure, need for revision  surgery, postop course, and   expectations were all   discussed and reviewed.  Consent was obtained for benefit of pain   relief.      PROCEDURE IN DETAIL:  The patient was brought to the operative theater.   Once adequate anesthesia, preoperative antibiotics, 3 gm of Ancef administered, the patient was positioned supine with the left thigh tourniquet placed.  The  left lower extremity was prepped and draped in sterile fashion.  A time-   out was performed identifying the patient, planned procedure, and   extremity.      The left lower extremity was placed in the [facility] leg holder.  The leg was   exsanguinated, tourniquet elevated to 250 mmHg.  A midline incision was   made followed by median parapatellar arthrotomy.  Following initial   exposure, attention was first directed to  the patella.  Precut   measurement was noted to be 23 mm.  I resected down to 14 mm and used a   38 patellar button to restore patellar height as well as cover the cut   surface.      The lug holes were drilled and a metal shim was placed to protect the   patella from retractors and saw blades.      At this point, attention was now directed to the femur.  The femoral   canal was opened with a drill, irrigated to try to prevent fat emboli.  An   intramedullary rod was passed at 5 degrees valgus, 10 mm of bone was   resected off the distal femur.  Following this resection, the tibia was   subluxated anteriorly.  Using the extramedullary guide, 4 mm of bone was resected off   the proximal medial tibia.  We confirmed the gap would be   stable medially and laterally with a 10 mm insert as well as confirmed   the cut was perpendicular in the coronal plane, checking with an alignment rod.      Once this was done, I sized the femur to be a size 3 in the anterior-   posterior dimension, chose a standard component based on medial and   lateral dimension.  The size 3 rotation block was then pinned in   position  anterior referenced using the C-clamp to set rotation.  The   anterior, posterior, and  chamfer cuts were made without difficulty nor   notching making certain that I was along the anterior cortex to help   with flexion gap stability.      The final box cut was made off the lateral aspect of distal femur.      At this point, the tibia was sized to be a size 2.5, the size 2.5 tray was   then pinned in position through the medial third of the tubercle,   drilled, and keel punched.  Trial reduction was now carried with a 3 femur,  2.5 tibia, a 10 then the 12.61mm insert, and the 38 patella botton.  The knee was brought to   extension, full extension with good flexion stability with the patella   tracking through the trochlea without application of pressure.  Given   all these findings, the trial components removed.  Final components were   opened and cement was mixed.  The knee was irrigated with normal saline   solution and pulse lavage.  The synovial lining was   then injected with 20cc of Exparel, 30cc of 0.25% Marcaine with epinephrine and 1 cc of Toradol,   total of 61 cc.      The knee was irrigated.  Final implants were then cemented onto clean and   dried cut surfaces of bone with the knee brought to extension with a 12.5 mm trial insert.      Once the cement had fully cured, the excess cement was removed   throughout the knee.  I confirmed I was satisfied with the range of   motion and stability, and the final 12.5 mm PS insert was chosen.  It was   placed into the knee.      The tourniquet had been let down at 38 minutes.  No significant   hemostasis required.  The   extensor mechanism was then reapproximated using #1 Vicryl with the knee   in flexion.  The remaining wound was closed with  2-0 Vicryl and running 4-0 Monocryl.   The knee was cleaned, dried, dressed sterilely using Dermabond and   Aquacel dressing.  The patient was then   brought to recovery room in stable  condition, tolerating the procedure   well.   Please note that Physician Assistant, Danae Orleans, PA-C, was present for the entirety of the case, and was utilized for pre-operative positioning, peri-operative retractor management, general facilitation of the procedure.  He was also utilized for primary wound closure at the end of the case.              Pietro Cassis Alvan Dame, M.D.    01/17/2014 3:28 PM

## 2014-01-17 NOTE — Plan of Care (Signed)
Problem: Consults Goal: Diagnosis- Total Joint Replacement Primary Total Knee     

## 2014-01-17 NOTE — Interval H&P Note (Signed)
History and Physical Interval Note:  01/17/2014 1:35 PM  Katelyn Watson  has presented today for surgery, with the diagnosis of LEFT KNEE OA  The various methods of treatment have been discussed with the patient and family. After consideration of risks, benefits and other options for treatment, the patient has consented to  Procedure(s): LEFT TOTAL KNEE ARTHROPLASTY (Left) as a surgical intervention .  The patient's history has been reviewed, patient examined, no change in status, stable for surgery.  I have reviewed the patient's chart and labs.  Questions were answered to the patient's satisfaction.     Mauri Pole

## 2014-01-17 NOTE — Anesthesia Procedure Notes (Signed)
Spinal  Start time: 01/17/2014 1:45 PM End time: 01/17/2014 1:50 PM Staffing CRNA/Resident: Harle Stanford R Performed by: resident/CRNA  Preanesthetic Checklist Completed: patient identified, site marked, surgical consent, pre-op evaluation, timeout performed, IV checked, risks and benefits discussed and monitors and equipment checked Spinal Block Patient position: sitting Prep: Betadine Approach: midline Location: L3-4 Needle Needle type: Sprotte  Needle gauge: 25 G Needle length: 12.7 cm Needle insertion depth: 8 cm Assessment Sensory level: T6

## 2014-01-18 ENCOUNTER — Encounter (HOSPITAL_COMMUNITY): Payer: Self-pay | Admitting: Orthopedic Surgery

## 2014-01-18 LAB — CBC
HCT: 32.6 % — ABNORMAL LOW (ref 36.0–46.0)
Hemoglobin: 11.1 g/dL — ABNORMAL LOW (ref 12.0–15.0)
MCH: 30.7 pg (ref 26.0–34.0)
MCHC: 34 g/dL (ref 30.0–36.0)
MCV: 90.1 fL (ref 78.0–100.0)
PLATELETS: 205 10*3/uL (ref 150–400)
RBC: 3.62 MIL/uL — ABNORMAL LOW (ref 3.87–5.11)
RDW: 13.2 % (ref 11.5–15.5)
WBC: 13.1 10*3/uL — ABNORMAL HIGH (ref 4.0–10.5)

## 2014-01-18 LAB — BASIC METABOLIC PANEL
Anion gap: 12 (ref 5–15)
BUN: 16 mg/dL (ref 6–23)
CALCIUM: 8.6 mg/dL (ref 8.4–10.5)
CO2: 27 mEq/L (ref 19–32)
Chloride: 104 mEq/L (ref 96–112)
Creatinine, Ser: 0.95 mg/dL (ref 0.50–1.10)
GFR calc non Af Amer: 60 mL/min — ABNORMAL LOW (ref >90)
GFR, EST AFRICAN AMERICAN: 69 mL/min — AB (ref >90)
Glucose, Bld: 141 mg/dL — ABNORMAL HIGH (ref 70–99)
Potassium: 3.3 mEq/L — ABNORMAL LOW (ref 3.7–5.3)
Sodium: 143 mEq/L (ref 137–147)

## 2014-01-18 NOTE — Progress Notes (Signed)
Patient ID: Katelyn Watson, female   DOB: Jul 20, 1945, 68 y.o.   MRN: [id number] Subjective: 1 Day Post-Op Procedure(s) (LRB): LEFT TOTAL KNEE ARTHROPLASTY (Left)    Patient reports pain as moderate.  Comfortable this am eating breakfast.  She feels it is not as bad as anticipated  Objective:   VITALS:   Filed Vitals:   01/18/14 1108  BP:   Pulse:   Temp:   Resp: 18    Neurovascular intact Incision: dressing C/D/I  LABS  Recent Labs  01/18/14 0511  HGB 11.1*  HCT 32.6*  WBC 13.1*  PLT 205     Recent Labs  01/18/14 0511  NA 143  K 3.3*  BUN 16  CREATININE 0.95  GLUCOSE 141*    No results found for this basename: LABPT, INR,  in the last 72 hours   Assessment/Plan: 1 Day Post-Op Procedure(s) (LRB): LEFT TOTAL KNEE ARTHROPLASTY (Left)   Advance diet Up with therapy Plan for discharge tomorrow to SNF

## 2014-01-18 NOTE — Progress Notes (Signed)
OT Cancellation Note  Patient Details Name: Katelyn Watson MRN: [id number] DOB: Feb 28, 1946   Cancelled Treatment:    Reason Eval/Treat Not Completed: Other (comment) Defer OT eval to SNF.  Jules Schick [phone] 01/18/2014, 11:50 AM

## 2014-01-18 NOTE — Progress Notes (Signed)
CARE MANAGEMENT NOTE 01/18/2014  Patient:  Katelyn Watson, Katelyn Watson   Account Number:  [id number]  Date Initiated:  01/18/2014  Documentation initiated by:  Skylah Delauter  Subjective/Objective Assessment:   left total knee replacement     Action/Plan:   plan to go snf post hospital stay   Anticipated DC Date:  01/21/2014   Anticipated DC Plan:  SKILLED NURSING FACILITY  In-house referral  Clinical Social Worker      DC Planning Services  NA      [facility] Choice  NA   Choice offered to / List presented to:  NA   DME arranged  NA      DME agency  NA     [place] arranged  NA      [place] agency  NA   Status of service:  In process, will continue to follow Medicare Important Message given?  NA - LOS <3 / Initial given by admissions (If response is "NO", the following Medicare IM given date fields will be blank) Date Medicare IM given:   Medicare IM given by:   Date Additional Medicare IM given:   Additional Medicare IM given by:    Discharge Disposition:    Per UR Regulation:  Reviewed for med. necessity/level of care/duration of stay  If discussed at [place] of Stay Meetings, dates discussed:    Comments:  Daine Gip

## 2014-01-18 NOTE — Progress Notes (Signed)
Clinical Social Work Department CLINICAL SOCIAL WORK PLACEMENT NOTE 01/18/2014  Patient:  Katelyn Watson, Katelyn Watson  Account Number:  [id number] Admit date:  01/17/2014  Clinical Social Worker:  Werner Lean, LCSW  Date/time:  01/18/2014 02:02 PM  Clinical Social Work is seeking post-discharge placement for this patient at the following level of care:      (*CSW will update this form in Epic as items are completed)     Patient/family provided with [place] Department of Clinical Social Work's list of facilities offering this level of care within the geographic area requested by the patient (or if unable, by the patient's family).  01/18/2014  Patient/family informed of their freedom to choose among providers that offer the needed level of care, that participate in Medicare, Medicaid or managed care program needed by the patient, have an available bed and are willing to accept the patient.    Patient/family informed of [facility]' ownership interest in [facility], as well as of the fact that they are under no obligation to receive care at this facility.  PASARR submitted to EDS on 01/18/2014 PASARR number received on 01/18/2014  FL2 transmitted to all facilities in geographic area requested by pt/family on  01/18/2014 FL2 transmitted to all facilities within larger geographic area on   Patient informed that his/her managed care company has contracts with or will negotiate with  certain facilities, including the following:     Patient/family informed of bed offers received:  01/18/2014 Patient chooses bed at  Physician recommends and patient chooses bed at  [facility] Patient to be transferred to  on   Patient to be transferred to facility by  Patient and family notified of transfer on  Name of family member notified:    The following physician request were entered in Epic:   Additional Comments:   Werner Lean LCSW [phone]

## 2014-01-18 NOTE — Progress Notes (Signed)
Physical Therapy Treatment Patient Details Name: ALUEL SCHWARZ MRN: [id number] DOB: 1945-12-24 Today's Date: 01/18/2014    History of Present Illness Pt is a 68 year old female s/p L TKA with hx of breast cancer.    PT Comments    POD # 1 pm session.  Pt OOB in recliner.  Assited with standing to amb in hallway.  Pt required increased time and 25% VC's on proper sequencing and proper walker to self distance.   Follow Up Recommendations  SNF([facility])     Equipment Recommendations  None recommended by PT    Recommendations for Other Services       Precautions / Restrictions Precautions Precautions: Knee Restrictions Other Position/Activity Restrictions: WBAT    Mobility  Bed Mobility Overal bed mobility: Needs Assistance Bed Mobility: Supine to Sit     Supine to sit: Min guard;HOB elevated     General bed mobility comments: verbal cues for technique  Transfers Overall transfer level: Needs assistance Equipment used: Rolling walker (2 wheeled) Transfers: Sit to/from Stand Sit to Stand: Min assist         General transfer comment: verbal cues for UE and LE placement  Ambulation/Gait Ambulation/Gait assistance: Min assist Ambulation Distance (Feet): 40 Feet Assistive device: Rolling walker (2 wheeled) Gait Pattern/deviations: Step-to pattern;Antalgic;Decreased stance time - left Gait velocity: decr   General Gait Details: verbal cues for sequence, step length, RW positioning, posture   Stairs            Wheelchair Mobility    Modified Rankin (Stroke Patients Only)       Balance                                    Cognition Arousal/Alertness: Awake/alert Behavior During Therapy: WFL for tasks assessed/performed Overall Cognitive Status: Within Functional Limits for tasks assessed                      Exercises     General Comments        Pertinent Vitals/Pain Pain Assessment: 0-10 Pain Score: 1  Pain  Location: L  knee Pain Descriptors / Indicators: Sore Pain Intervention(s): Limited activity within patient's tolerance;Monitored during session;Repositioned;Ice applied    Home Living Family/patient expects to be discharged to:: Skilled nursing facility Living Arrangements: Alone                  Prior Function Level of Independence: Independent          PT Goals (current goals can now be found in the care plan section) Acute Rehab PT Goals PT Goal Formulation: With patient Time For Goal Achievement: 01/25/14 Potential to Achieve Goals: Good    Frequency  7X/week    PT Plan      Co-evaluation             End of Session   Activity Tolerance: Patient tolerated treatment well Patient left: in chair;with call bell/phone within reach     Time: 1440-1505 PT Time Calculation (min): 25 min  Charges:  $Gait Training: 8-22 mins $Therapeutic Activity: 8-22 mins                    G Codes:      Rica Koyanagi  PTA [facility]  Acute  Rehab Pager      [phone]

## 2014-01-18 NOTE — Evaluation (Signed)
Physical Therapy Evaluation Patient Details Name: Katelyn Watson MRN: [id number] DOB: 1945-07-25 Today's Date: 01/18/2014   History of Present Illness  Pt is a 68 year old female s/p L TKA with hx of breast cancer.  Clinical Impression  Pt is s/p L TKA resulting in the deficits listed below (see PT Problem List).  Pt will benefit from skilled PT to increase their independence and safety with mobility to allow discharge to the venue listed below.  Pt plans to d/c to SNF since she lives alone.       Follow Up Recommendations SNF    Equipment Recommendations  None recommended by PT    Recommendations for Other Services       Precautions / Restrictions Precautions Precautions: Knee Restrictions Other Position/Activity Restrictions: WBAT      Mobility  Bed Mobility Overal bed mobility: Needs Assistance Bed Mobility: Supine to Sit     Supine to sit: Min guard;HOB elevated     General bed mobility comments: verbal cues for technique  Transfers Overall transfer level: Needs assistance Equipment used: Rolling walker (2 wheeled) Transfers: Sit to/from Stand Sit to Stand: Min assist         General transfer comment: verbal cues for UE and LE placement  Ambulation/Gait Ambulation/Gait assistance: Min assist Ambulation Distance (Feet): 40 Feet Assistive device: Rolling walker (2 wheeled) Gait Pattern/deviations: Step-to pattern;Antalgic;Decreased stance time - left Gait velocity: decr   General Gait Details: verbal cues for sequence, step length, RW positioning, posture  Stairs            Wheelchair Mobility    Modified Rankin (Stroke Patients Only)       Balance                                             Pertinent Vitals/Pain Pain Assessment: 0-10 Pain Score: 1  Pain Location: L  knee Pain Descriptors / Indicators: Sore Pain Intervention(s): Limited activity within patient's tolerance;Monitored during session;Repositioned;Ice  applied    Home Living Family/patient expects to be discharged to:: Skilled nursing facility Living Arrangements: Alone                    Prior Function Level of Independence: Independent               Hand Dominance        Extremity/Trunk Assessment               Lower Extremity Assessment: LLE deficits/detail   LLE Deficits / Details: good quad contraction, unable to perform SLR without assist, knee flexion AAROM 80*     Communication   Communication: No difficulties  Cognition Arousal/Alertness: Awake/alert Behavior During Therapy: WFL for tasks assessed/performed Overall Cognitive Status: Within Functional Limits for tasks assessed                      General Comments      Exercises Total Joint Exercises Ankle Circles/Pumps: AROM;Both;15 reps Quad Sets: AROM;Both;10 reps Towel Squeeze: AROM;Both;10 reps Short Arc Quad: AROM;Both;10 reps Heel Slides: Left;AAROM;15 reps Hip ABduction/ADduction: AROM;Left;10 reps Straight Leg Raises: AAROM;Left;10 reps      Assessment/Plan    PT Assessment Patient needs continued PT services  PT Diagnosis Difficulty walking   PT Problem List Decreased strength;Decreased range of motion;Decreased mobility;Decreased knowledge of precautions;Decreased knowledge of use of DME  PT Treatment Interventions Functional mobility training;Patient/family education;DME instruction;Gait training;Therapeutic activities;Therapeutic exercise   PT Goals (Current goals can be found in the Care Plan section) Acute Rehab PT Goals PT Goal Formulation: With patient Time For Goal Achievement: 01/25/14 Potential to Achieve Goals: Good    Frequency 7X/week   Barriers to discharge        Co-evaluation               End of Session   Activity Tolerance: Patient tolerated treatment well Patient left: in chair;with call bell/phone within reach           Time: 0925-0952 PT Time Calculation (min): 27  min   Charges:   PT Evaluation $Initial PT Evaluation Tier I: 1 Procedure PT Treatments $Gait Training: 8-22 mins $Therapeutic Exercise: 8-22 mins   PT G Codes:          Keonda Dow,KATHrine E 01/18/2014, 12:18 PM Carmelia Bake, PT, DPT 01/18/2014 Pager: [phone]

## 2014-01-18 NOTE — Progress Notes (Signed)
Clinical Social Work Department BRIEF PSYCHOSOCIAL ASSESSMENT 01/18/2014  Patient:  Katelyn Watson, Katelyn Watson     Account Number:  [id number]     Admit date:  01/17/2014  Clinical Social Worker:  Lacie Scotts  Date/Time:  01/18/2014 01:55 PM  Referred by:  Physician  Date Referred:  01/17/2014 Referred for  SNF Placement   Other Referral:   Interview type:  Patient Other interview type:    PSYCHOSOCIAL DATA Living Status:  ALONE Admitted from facility:   Level of care:   Primary support name:  Dorian Heckle Primary support relationship to patient:  FRIEND Degree of support available:   unclear    CURRENT CONCERNS Current Concerns  Post-Acute Placement   Other Concerns:    SOCIAL WORK ASSESSMENT / PLAN Pt is a 64 yr. old female living at home prior to hospitalization. CSW met with pt to assist with d/c planning. This is a planned admission. Pt has made prior arrangements to have ST Rehab at [facility] following hospital d/c. SNF has been contacted and d/c plans have been confirmed. CSW will continue to follow to assist with d/c planning to SNF when stable.   Assessment/plan status:  Psychosocial Support/Ongoing Assessment of Needs Other assessment/ plan:   Information/referral to community resources:   Insurance coverage for SNF and ambulance transport reviewed.    PATIENT'S/FAMILY'S RESPONSE TO PLAN OF CARE: Pt's mood is bright. Pain is controlled. She is motivated to begin therapy. Pt is looking forward to having rehab at [facility].   Werner Lean LCSW [phone]

## 2014-01-19 DIAGNOSIS — G8918 Other acute postprocedural pain: Secondary | ICD-10-CM | POA: Diagnosis not present

## 2014-01-19 DIAGNOSIS — I130 Hypertensive heart and chronic kidney disease with heart failure and stage 1 through stage 4 chronic kidney disease, or unspecified chronic kidney disease: Secondary | ICD-10-CM | POA: Diagnosis not present

## 2014-01-19 DIAGNOSIS — M810 Age-related osteoporosis without current pathological fracture: Secondary | ICD-10-CM | POA: Diagnosis not present

## 2014-01-19 DIAGNOSIS — D62 Acute posthemorrhagic anemia: Secondary | ICD-10-CM | POA: Diagnosis not present

## 2014-01-19 DIAGNOSIS — IMO0002 Reserved for concepts with insufficient information to code with codable children: Secondary | ICD-10-CM | POA: Diagnosis not present

## 2014-01-19 DIAGNOSIS — K219 Gastro-esophageal reflux disease without esophagitis: Secondary | ICD-10-CM | POA: Diagnosis not present

## 2014-01-19 DIAGNOSIS — Z853 Personal history of malignant neoplasm of breast: Secondary | ICD-10-CM | POA: Diagnosis not present

## 2014-01-19 DIAGNOSIS — M84469D Pathological fracture, unspecified tibia and fibula, subsequent encounter for fracture with routine healing: Secondary | ICD-10-CM | POA: Diagnosis not present

## 2014-01-19 DIAGNOSIS — F3289 Other specified depressive episodes: Secondary | ICD-10-CM | POA: Diagnosis not present

## 2014-01-19 DIAGNOSIS — E785 Hyperlipidemia, unspecified: Secondary | ICD-10-CM | POA: Diagnosis not present

## 2014-01-19 DIAGNOSIS — F329 Major depressive disorder, single episode, unspecified: Secondary | ICD-10-CM | POA: Diagnosis not present

## 2014-01-19 DIAGNOSIS — M1710 Unilateral primary osteoarthritis, unspecified knee: Secondary | ICD-10-CM | POA: Diagnosis not present

## 2014-01-19 DIAGNOSIS — R262 Difficulty in walking, not elsewhere classified: Secondary | ICD-10-CM | POA: Diagnosis not present

## 2014-01-19 DIAGNOSIS — M129 Arthropathy, unspecified: Secondary | ICD-10-CM | POA: Diagnosis not present

## 2014-01-19 DIAGNOSIS — I509 Heart failure, unspecified: Secondary | ICD-10-CM | POA: Diagnosis not present

## 2014-01-19 DIAGNOSIS — Z96659 Presence of unspecified artificial knee joint: Secondary | ICD-10-CM | POA: Diagnosis not present

## 2014-01-19 DIAGNOSIS — E039 Hypothyroidism, unspecified: Secondary | ICD-10-CM | POA: Diagnosis not present

## 2014-01-19 DIAGNOSIS — R0602 Shortness of breath: Secondary | ICD-10-CM | POA: Diagnosis not present

## 2014-01-19 DIAGNOSIS — J45909 Unspecified asthma, uncomplicated: Secondary | ICD-10-CM | POA: Diagnosis not present

## 2014-01-19 DIAGNOSIS — I10 Essential (primary) hypertension: Secondary | ICD-10-CM | POA: Diagnosis not present

## 2014-01-19 DIAGNOSIS — D649 Anemia, unspecified: Secondary | ICD-10-CM | POA: Diagnosis not present

## 2014-01-19 HISTORY — DX: Morbid (severe) obesity due to excess calories: E66.01

## 2014-01-19 LAB — CBC
HCT: 28.8 % — ABNORMAL LOW (ref 36.0–46.0)
Hemoglobin: 9.9 g/dL — ABNORMAL LOW (ref 12.0–15.0)
MCH: 31.3 pg (ref 26.0–34.0)
MCHC: 34.4 g/dL (ref 30.0–36.0)
MCV: 91.1 fL (ref 78.0–100.0)
PLATELETS: 195 10*3/uL (ref 150–400)
RBC: 3.16 MIL/uL — ABNORMAL LOW (ref 3.87–5.11)
RDW: 13.6 % (ref 11.5–15.5)
WBC: 12.9 10*3/uL — ABNORMAL HIGH (ref 4.0–10.5)

## 2014-01-19 LAB — BASIC METABOLIC PANEL
ANION GAP: 10 (ref 5–15)
BUN: 23 mg/dL (ref 6–23)
CHLORIDE: 107 meq/L (ref 96–112)
CO2: 27 mEq/L (ref 19–32)
CREATININE: 0.97 mg/dL (ref 0.50–1.10)
Calcium: 8.8 mg/dL (ref 8.4–10.5)
GFR calc non Af Amer: 58 mL/min — ABNORMAL LOW (ref >90)
GFR, EST AFRICAN AMERICAN: 68 mL/min — AB (ref >90)
Glucose, Bld: 142 mg/dL — ABNORMAL HIGH (ref 70–99)
POTASSIUM: 3.9 meq/L (ref 3.7–5.3)
Sodium: 144 mEq/L (ref 137–147)

## 2014-01-19 MED ORDER — FERROUS SULFATE 325 (65 FE) MG PO TABS: 325.0000 mg | ORAL_TABLET | Freq: Three times a day (TID) | ORAL | Status: DC

## 2014-01-19 MED ORDER — HYDROCODONE-ACETAMINOPHEN 7.5-325 MG PO TABS: 1.0000 | ORAL_TABLET | ORAL | Status: DC | PRN

## 2014-01-19 MED ORDER — ASPIRIN 325 MG PO TBEC: 325.0000 mg | DELAYED_RELEASE_TABLET | Freq: Two times a day (BID) | ORAL | Status: AC

## 2014-01-19 MED ORDER — TIZANIDINE HCL 4 MG PO TABS: 4.0000 mg | ORAL_TABLET | Freq: Four times a day (QID) | ORAL | Status: DC | PRN

## 2014-01-19 MED ORDER — POLYETHYLENE GLYCOL 3350 17 G PO PACK: 17.0000 g | PACK | Freq: Two times a day (BID) | ORAL | Status: DC

## 2014-01-19 MED ORDER — DSS 100 MG PO CAPS: 100.0000 mg | ORAL_CAPSULE | Freq: Two times a day (BID) | ORAL | Status: DC

## 2014-01-19 NOTE — Discharge Summary (Signed)
Physician Discharge Summary  Patient ID: Katelyn Watson MRN: [id number] DOB/AGE: 1945-10-17 68 y.o.  Admit date: 01/17/2014 Discharge date:  01/19/2014  Procedures:  Procedure(s) (LRB): LEFT TOTAL KNEE ARTHROPLASTY (Left)  Attending Physician:  Dr. Paralee Cancel   Admission Diagnoses:   Left knee OA / pain  Discharge Diagnoses:  Principal Problem:   S/P left TKA Active Problems:   Morbid obesity   Postoperative anemia due to acute blood loss  Past Medical History  Diagnosis Date  . Asthma     ??  . HLD (hyperlipidemia)   . HTN (hypertension)   . Breast cancer     treated with masectomy, chemo, and xrt  . Family history of anesthesia complication     brother- became stiff after sinus surgery   . Shortness of breath     slight with exertion   . Hypothyroidism   . Depression   . GERD (gastroesophageal reflux disease)   . Arthritis   . Anemia     hx of     HPI:    Katelyn Watson, 68 y.o. female, has a history of pain and functional disability in the left knee due to arthritis and has failed non-surgical conservative treatments for greater than 12 weeks to include NSAID's and/or analgesics, corticosteriod injections, use of assistive devices and activity modification. Onset of symptoms was gradual, starting 2+ years ago with gradually worsening course since that time. The patient noted no past surgery on the left knee(s). Patient currently rates pain in the left knee(s) at 10 out of 10 with activity. Patient has night pain, worsening of pain with activity and weight bearing, pain that interferes with activities of daily living, pain with passive range of motion, crepitus and joint swelling. Patient has evidence of periarticular osteophytes and joint space narrowing by imaging studies. There is no active infection. Risks, benefits and expectations were discussed with the patient. Risks including but not limited to the risk of anesthesia, blood clots, nerve damage, blood vessel  damage, failure of the prosthesis, infection and up to and including death. Patient understand the risks, benefits and expectations and wishes to proceed with surgery.  PCP: Ernestene Kiel, MD   Discharged Condition: good  Hospital Course:  Patient underwent the above stated procedure on 01/17/2014. Patient tolerated the procedure well and brought to the recovery room in good condition and subsequently to the floor.  POD #1 BP: 111/68 ; Pulse: 76 ; Temp: 97.9 F (36.6 C) ; Resp: 18 Patient reports pain as moderate. Comfortable this am eating breakfast. She feels it is not as bad as anticipated. Dorsiflexion/plantar flexion intact, incision: dressing C/D/I, no cellulitis present and compartment soft.   LABS  Basename    HGB  11.1  HCT  32.6   POD #2  BP: 155/71 ; Pulse: 78 ; Temp: 98.4 F (36.9 C) ; Resp: 16 Patient reports pain as mild, pain controlled. Feels better that she expected it to. No events throughout the night. Ready to be discharged to skilled nursing facility. Dorsiflexion/plantar flexion intact, incision: dressing C/D/I, no cellulitis present and compartment soft.   LABS  Basename    HGB  9.9  HCT  28.8    Discharge Exam: General appearance: alert, cooperative and no distress Extremities: Homans sign is negative, no sign of DVT, no edema, redness or tenderness in the calves or thighs and no ulcers, gangrene or trophic changes  Disposition:     Skilled nursing facility with follow up in 2 weeks  Follow-up Information   Follow up with Mauri Pole, MD. Schedule an appointment as soon as possible for a visit in 2 weeks.   Specialty:  Orthopedic Surgery   Contact information:   [address] [place] [address] [phone]       Discharge Instructions   Call MD / Call 911    Complete by:  As directed   If you experience chest pain or shortness of breath, CALL 911 and be transported to the hospital emergency room.  If you develope a  fever above 101 F, pus (white drainage) or increased drainage or redness at the wound, or calf pain, call your surgeon's office.     Change dressing    Complete by:  As directed   Maintain surgical dressing for 10-14 days, or until follow up in the clinic.     Constipation Prevention    Complete by:  As directed   Drink plenty of fluids.  Prune juice may be helpful.  You may use a stool softener, such as Colace (over the counter) 100 mg twice a day.  Use MiraLax (over the counter) for constipation as needed.     Diet - low sodium heart healthy    Complete by:  As directed      Discharge instructions    Complete by:  As directed   Maintain surgical dressing for 10-14 days, or until follow up in the clinic. Follow up in 2 weeks at [facility]. Call with any questions or concerns.     Increase activity slowly as tolerated    Complete by:  As directed      TED hose    Complete by:  As directed   Use stockings (TED hose) for 2 weeks on both leg(s).  You may remove them at night for sleeping.     Weight bearing as tolerated    Complete by:  As directed   Laterality:  left  Extremity:  Lower             Medication List    STOP taking these medications       acetaminophen 500 MG tablet  Commonly known as:  TYLENOL     acetaminophen-codeine 300-30 MG per tablet  Commonly known as:  TYLENOL #3     naproxen sodium 220 MG tablet  Commonly known as:  ANAPROX      TAKE these medications       aspirin 325 MG EC tablet  Take 1 tablet (325 mg total) by mouth 2 (two) times daily.     atorvastatin 10 MG tablet  Commonly known as:  LIPITOR  Take 10 mg by mouth at bedtime.     CALTRATE 600+D PLUS 600-400 MG-UNIT per tablet  Take 2 tablets by mouth daily.     carvedilol 12.5 MG tablet  Commonly known as:  COREG  Take 12.5 mg by mouth 2 (two) times daily with a meal.     CENTRUM SILVER PO  Take 1 tablet by mouth daily.     DSS 100 MG Caps  Take 100 mg by mouth 2  (two) times daily.     ferrous sulfate 325 (65 FE) MG tablet  Take 1 tablet (325 mg total) by mouth 3 (three) times daily after meals.     fexofenadine 180 MG tablet  Commonly known as:  ALLEGRA  Take 180 mg by mouth daily.     glucosamine-chondroitin 500-400 MG tablet  Take 1 tablet by mouth  2 (two) times daily.     hydrochlorothiazide 25 MG tablet  Commonly known as:  HYDRODIURIL  Take 25 mg by mouth every morning.     HYDROcodone-acetaminophen 7.5-325 MG per tablet  Commonly known as:  NORCO  Take 1-2 tablets by mouth every 4 (four) hours as needed for moderate pain.     levothyroxine 100 MCG tablet  Commonly known as:  SYNTHROID, LEVOTHROID  Take 100 mcg by mouth daily before breakfast.     losartan 100 MG tablet  Commonly known as:  COZAAR  Take 100 mg by mouth at bedtime.     omeprazole 20 MG capsule  Commonly known as:  PRILOSEC  Take 20 mg by mouth 2 (two) times daily.     polyethylene glycol packet  Commonly known as:  MIRALAX / GLYCOLAX  Take 17 g by mouth 2 (two) times daily.     potassium chloride 10 MEQ tablet  Commonly known as:  K-DUR,KLOR-CON  Take 10 mEq by mouth 2 (two) times daily.     albuterol (2.5 MG/3ML) 0.083% nebulizer solution  Commonly known as:  PROVENTIL  Take 2.5 mg by nebulization every 6 (six) hours as needed for wheezing or shortness of breath.     PROVENTIL HFA 108 (90 BASE) MCG/ACT inhaler  Generic drug:  albuterol  Inhale 1-2 puffs into the lungs every 6 (six) hours as needed for wheezing or shortness of breath.     sertraline 50 MG tablet  Commonly known as:  ZOLOFT  Take 50 mg by mouth every morning.     tiZANidine 4 MG tablet  Commonly known as:  ZANAFLEX  Take 1 tablet (4 mg total) by mouth every 6 (six) hours as needed for muscle spasms.     torsemide 10 MG tablet  Commonly known as:  DEMADEX  Take 10 mg by mouth daily as needed (swelling).     torsemide 10 MG tablet  Commonly known as:  DEMADEX  Take 10 mg by  mouth daily.     traZODone 150 MG tablet  Commonly known as:  DESYREL  Take 150 mg by mouth at bedtime.         Signed: [place] P[place]. Bereket Gernert   PA-C  01/19/2014, 9:46 AM

## 2014-01-19 NOTE — Progress Notes (Addendum)
Discharged from floor via w/c, family with pt. Going to [facility] in [place]. No changes in assessment.  Miriam Liles, [employer]

## 2014-01-19 NOTE — Plan of Care (Signed)
Problem: Discharge Progression Outcomes Goal: Anticoagulant follow-up in place Outcome: Not Applicable Date Met:  50/15/86 asa Goal: Discharge plan in place and appropriate Outcome: Completed/Met Date Met:  01/19/14 SNF for rehab

## 2014-01-19 NOTE — Progress Notes (Addendum)
Clinical Social Work Department CLINICAL SOCIAL WORK PLACEMENT NOTE 01/19/2014  Patient:  Katelyn Watson, Katelyn Watson  Account Number:  [id number] Admit date:  01/17/2014  Clinical Social Worker:  Werner Lean, LCSW  Date/time:  01/19/2014 11:39 AM  Clinical Social Work is seeking post-discharge placement for this patient at the following level of care:   SKILLED NURSING   (*CSW will update this form in Epic as items are completed)     Patient/family provided with [place] Department of Clinical Social Work's list of facilities offering this level of care within the geographic area requested by the patient (or if unable, by the patient's family).  01/18/2014  Patient/family informed of their freedom to choose among providers that offer the needed level of care, that participate in Medicare, Medicaid or managed care program needed by the patient, have an available bed and are willing to accept the patient.    Patient/family informed of [facility]' ownership interest in [facility], as well as of the fact that they are under no obligation to receive care at this facility.  PASARR submitted to EDS on 01/18/2014 PASARR number received on 01/18/2014  FL2 transmitted to all facilities in geographic area requested by pt/family on  01/18/2014 FL2 transmitted to all facilities within larger geographic area on   Patient informed that his/her managed care company has contracts with or will negotiate with  certain facilities, including the following:     Patient/family informed of bed offers received:  01/18/2014 Patient chooses bed at  Physician recommends and patient chooses bed at  [facility] [place] Patient to be transferred to [facility] on  01/19/2014 Patient to be transferred to facility by [facility] Patient and family notified of transfer on 01/19/2014 Name of family member notified:  COUSIN  The following physician request were entered in Epic:   Additional Comments: Pt / family are in  agreement with d/c today to SNF via family transport. NSG reviewed d/c summary, scripts,avs. Scripts included in d/c packet.  Werner Lean LCSW [phone]

## 2014-01-19 NOTE — Care Management Note (Signed)
    Page 1 of 2   01/19/2014     12:06:09 PM CARE MANAGEMENT NOTE 01/19/2014  Patient:  Katelyn Watson, Katelyn Watson   Account Number:  [id number]  Date Initiated:  01/18/2014  Documentation initiated by:  DAVIS,RHONDA  Subjective/Objective Assessment:   left total knee replacement     Action/Plan:   plan to go snf post hospital stay   Anticipated DC Date:  01/21/2014   Anticipated DC Plan:  SKILLED NURSING FACILITY  In-house referral  Clinical Social Worker      DC Planning Services  NA      [facility] Choice  NA   Choice offered to / List presented to:  NA   DME arranged  NA      DME agency  NA     [place] arranged  NA      [place] agency  NA   Status of service:  Completed, signed off Medicare Important Message given?  NA - LOS <3 / Initial given by admissions (If response is "NO", the following Medicare IM given date fields will be blank) Date Medicare IM given:   Medicare IM given by:   Date Additional Medicare IM given:   Additional Medicare IM given by:    Discharge Disposition:  [place]  Per UR Regulation:  Reviewed for med. necessity/level of care/duration of stay  If discussed at [place] of Stay Meetings, dates discussed:    Comments:  Daine Gip

## 2014-01-19 NOTE — Progress Notes (Signed)
     Subjective: 2 Days Post-Op Procedure(s) (LRB): LEFT TOTAL KNEE ARTHROPLASTY (Left)   Patient reports pain as mild, pain controlled. Feels better that she expected it to. No events throughout the night. Ready to be discharged to skilled nursing facility.  Objective:   VITALS:   Filed Vitals:   01/19/14 0531  BP: 155/71  Pulse: 78  Temp: 98.4 F (36.9 C)  Resp: 16    Dorsiflexion/Plantar flexion intact Incision: dressing C/D/I No cellulitis present Compartment soft  LABS  Recent Labs  01/18/14 0511 01/19/14 0430  HGB 11.1* 9.9*  HCT 32.6* 28.8*  WBC 13.1* 12.9*  PLT 205 195     Recent Labs  01/18/14 0511 01/19/14 0430  NA 143 144  K 3.3* 3.9  BUN 16 23  CREATININE 0.95 0.97  GLUCOSE 141* 142*     Assessment/Plan: 2 Days Post-Op Procedure(s) (LRB): LEFT TOTAL KNEE ARTHROPLASTY (Left) Up with therapy Discharge to SNF, [facility] Follow up in 2 weeks at [facility]. Follow up with OLIN,Taegen Delker D in 2 weeks.  Contact information:  [facility] [address], [place] [phone]    Expected ABLA  Treated with iron and will observe  Morbid Obesity (BMI >40)  Estimated body mass index is 44.73 kg/(m^2) as calculated from the following:   Height as of this encounter: 5\' 6"  (1.676 m).   Weight as of this encounter: 125.646 kg (277 lb). Patient also counseled that weight may inhibit the healing process Patient counseled that losing weight will help with future health issues       [place] Pugh. Deira Shimer   PAC  01/19/2014, 9:41 AM

## 2014-01-19 NOTE — Progress Notes (Signed)
Physical Therapy Treatment Patient Details Name: Katelyn Watson MRN: [id number] DOB: Oct 18, 1945 Today's Date: 02-07-2014    History of Present Illness Pt is a 68 year old female s/p L TKA with hx of breast cancer.    PT Comments    Pt progressing well and anticipates d/c to SNF today.  Follow Up Recommendations  SNF     Equipment Recommendations  None recommended by PT    Recommendations for Other Services       Precautions / Restrictions Precautions Precautions: Knee Restrictions Weight Bearing Restrictions: No Other Position/Activity Restrictions: WBAT    Mobility  Bed Mobility Overal bed mobility: Needs Assistance Bed Mobility: Supine to Sit     Supine to sit: HOB elevated;Supervision     General bed mobility comments: verbal cues for technique  Transfers Overall transfer level: Needs assistance Equipment used: Rolling walker (2 wheeled) Transfers: Sit to/from Stand Sit to Stand: Min guard         General transfer comment: verbal cues for UE and LE placement  Ambulation/Gait Ambulation/Gait assistance: Min guard Ambulation Distance (Feet): 80 Feet Assistive device: Rolling walker (2 wheeled) Gait Pattern/deviations: Step-to pattern;Antalgic;Decreased stance time - left Gait velocity: decr   General Gait Details: verbal cues for  step length, RW positioning, posture, decreasing L toe out   Stairs            Wheelchair Mobility    Modified Rankin (Stroke Patients Only)       Balance                                    Cognition Arousal/Alertness: Awake/alert Behavior During Therapy: WFL for tasks assessed/performed Overall Cognitive Status: Within Functional Limits for tasks assessed                      Exercises Total Joint Exercises Ankle Circles/Pumps: AROM;Both;15 reps Quad Sets: AROM;Both;15 reps Towel Squeeze: AROM;Both;15 reps Short Arc Quad: AROM;Both;15 reps Heel Slides: Left;AAROM;15 reps Hip  ABduction/ADduction: AROM;Left;15 reps Straight Leg Raises: AAROM;Left;10 reps    General Comments        Pertinent Vitals/Pain Pain Assessment: 0-10 Pain Score: 2  Pain Location: L knee Pain Descriptors / Indicators: Sore Pain Intervention(s): Limited activity within patient's tolerance;Repositioned;Ice applied    Home Living                      Prior Function            PT Goals (current goals can now be found in the care plan section) Progress towards PT goals: Progressing toward goals    Frequency  7X/week    PT Plan Current plan remains appropriate    Co-evaluation             End of Session   Activity Tolerance: Patient tolerated treatment well Patient left: in chair;with call bell/phone within reach;with family/visitor present     Time: 6160-7371 PT Time Calculation (min): 27 min  Charges:  $Gait Training: 8-22 mins $Therapeutic Exercise: 8-22 mins                    G Codes:      Darral Rishel,KATHrine E 02/07/14, 9:46 AM Carmelia Bake, PT, DPT 02-07-14 Pager: [phone]

## 2014-02-03 DIAGNOSIS — M6281 Muscle weakness (generalized): Secondary | ICD-10-CM | POA: Diagnosis not present

## 2014-02-03 DIAGNOSIS — R269 Unspecified abnormalities of gait and mobility: Secondary | ICD-10-CM | POA: Diagnosis not present

## 2014-02-03 DIAGNOSIS — M25569 Pain in unspecified knee: Secondary | ICD-10-CM | POA: Diagnosis not present

## 2014-02-08 DIAGNOSIS — R269 Unspecified abnormalities of gait and mobility: Secondary | ICD-10-CM | POA: Diagnosis not present

## 2014-02-08 DIAGNOSIS — M25569 Pain in unspecified knee: Secondary | ICD-10-CM | POA: Diagnosis not present

## 2014-02-08 DIAGNOSIS — M6281 Muscle weakness (generalized): Secondary | ICD-10-CM | POA: Diagnosis not present

## 2014-02-10 DIAGNOSIS — M6281 Muscle weakness (generalized): Secondary | ICD-10-CM | POA: Diagnosis not present

## 2014-02-10 DIAGNOSIS — R269 Unspecified abnormalities of gait and mobility: Secondary | ICD-10-CM | POA: Diagnosis not present

## 2014-02-10 DIAGNOSIS — M25569 Pain in unspecified knee: Secondary | ICD-10-CM | POA: Diagnosis not present

## 2014-02-11 DIAGNOSIS — R269 Unspecified abnormalities of gait and mobility: Secondary | ICD-10-CM | POA: Diagnosis not present

## 2014-02-11 DIAGNOSIS — M6281 Muscle weakness (generalized): Secondary | ICD-10-CM | POA: Diagnosis not present

## 2014-02-11 DIAGNOSIS — M25569 Pain in unspecified knee: Secondary | ICD-10-CM | POA: Diagnosis not present

## 2014-02-14 DIAGNOSIS — M1710 Unilateral primary osteoarthritis, unspecified knee: Secondary | ICD-10-CM | POA: Diagnosis not present

## 2014-02-15 DIAGNOSIS — M6281 Muscle weakness (generalized): Secondary | ICD-10-CM | POA: Diagnosis not present

## 2014-02-15 DIAGNOSIS — R269 Unspecified abnormalities of gait and mobility: Secondary | ICD-10-CM | POA: Diagnosis not present

## 2014-02-15 DIAGNOSIS — M25569 Pain in unspecified knee: Secondary | ICD-10-CM | POA: Diagnosis not present

## 2014-02-18 DIAGNOSIS — B372 Candidiasis of skin and nail: Secondary | ICD-10-CM | POA: Diagnosis not present

## 2014-02-18 DIAGNOSIS — R1032 Left lower quadrant pain: Secondary | ICD-10-CM | POA: Diagnosis not present

## 2014-02-18 DIAGNOSIS — R197 Diarrhea, unspecified: Secondary | ICD-10-CM | POA: Diagnosis not present

## 2014-02-18 DIAGNOSIS — K5730 Diverticulosis of large intestine without perforation or abscess without bleeding: Secondary | ICD-10-CM | POA: Diagnosis not present

## 2014-02-18 DIAGNOSIS — N200 Calculus of kidney: Secondary | ICD-10-CM | POA: Diagnosis not present

## 2014-02-21 DIAGNOSIS — M6281 Muscle weakness (generalized): Secondary | ICD-10-CM | POA: Diagnosis not present

## 2014-02-21 DIAGNOSIS — M25569 Pain in unspecified knee: Secondary | ICD-10-CM | POA: Diagnosis not present

## 2014-02-21 DIAGNOSIS — R269 Unspecified abnormalities of gait and mobility: Secondary | ICD-10-CM | POA: Diagnosis not present

## 2014-02-21 DIAGNOSIS — R197 Diarrhea, unspecified: Secondary | ICD-10-CM | POA: Diagnosis not present

## 2014-02-23 DIAGNOSIS — M6281 Muscle weakness (generalized): Secondary | ICD-10-CM | POA: Diagnosis not present

## 2014-02-23 DIAGNOSIS — M25569 Pain in unspecified knee: Secondary | ICD-10-CM | POA: Diagnosis not present

## 2014-02-23 DIAGNOSIS — R269 Unspecified abnormalities of gait and mobility: Secondary | ICD-10-CM | POA: Diagnosis not present

## 2014-02-25 DIAGNOSIS — M25569 Pain in unspecified knee: Secondary | ICD-10-CM | POA: Diagnosis not present

## 2014-02-25 DIAGNOSIS — M6281 Muscle weakness (generalized): Secondary | ICD-10-CM | POA: Diagnosis not present

## 2014-02-25 DIAGNOSIS — R269 Unspecified abnormalities of gait and mobility: Secondary | ICD-10-CM | POA: Diagnosis not present

## 2014-02-28 DIAGNOSIS — M6281 Muscle weakness (generalized): Secondary | ICD-10-CM | POA: Diagnosis not present

## 2014-02-28 DIAGNOSIS — M25569 Pain in unspecified knee: Secondary | ICD-10-CM | POA: Diagnosis not present

## 2014-02-28 DIAGNOSIS — R269 Unspecified abnormalities of gait and mobility: Secondary | ICD-10-CM | POA: Diagnosis not present

## 2014-03-01 DIAGNOSIS — R269 Unspecified abnormalities of gait and mobility: Secondary | ICD-10-CM | POA: Diagnosis not present

## 2014-03-01 DIAGNOSIS — M6281 Muscle weakness (generalized): Secondary | ICD-10-CM | POA: Diagnosis not present

## 2014-03-01 DIAGNOSIS — M25569 Pain in unspecified knee: Secondary | ICD-10-CM | POA: Diagnosis not present

## 2014-03-02 DIAGNOSIS — Z96659 Presence of unspecified artificial knee joint: Secondary | ICD-10-CM | POA: Diagnosis not present

## 2014-03-02 DIAGNOSIS — Z471 Aftercare following joint replacement surgery: Secondary | ICD-10-CM | POA: Diagnosis not present

## 2014-03-04 DIAGNOSIS — M6281 Muscle weakness (generalized): Secondary | ICD-10-CM | POA: Diagnosis not present

## 2014-03-04 DIAGNOSIS — M25562 Pain in left knee: Secondary | ICD-10-CM | POA: Diagnosis not present

## 2014-03-04 DIAGNOSIS — R2689 Other abnormalities of gait and mobility: Secondary | ICD-10-CM | POA: Diagnosis not present

## 2014-03-07 DIAGNOSIS — K219 Gastro-esophageal reflux disease without esophagitis: Secondary | ICD-10-CM | POA: Diagnosis not present

## 2014-03-07 DIAGNOSIS — R1032 Left lower quadrant pain: Secondary | ICD-10-CM | POA: Diagnosis not present

## 2014-03-08 DIAGNOSIS — M25562 Pain in left knee: Secondary | ICD-10-CM | POA: Diagnosis not present

## 2014-03-08 DIAGNOSIS — R2689 Other abnormalities of gait and mobility: Secondary | ICD-10-CM | POA: Diagnosis not present

## 2014-03-08 DIAGNOSIS — M6281 Muscle weakness (generalized): Secondary | ICD-10-CM | POA: Diagnosis not present

## 2014-03-11 DIAGNOSIS — M6281 Muscle weakness (generalized): Secondary | ICD-10-CM | POA: Diagnosis not present

## 2014-03-11 DIAGNOSIS — R2689 Other abnormalities of gait and mobility: Secondary | ICD-10-CM | POA: Diagnosis not present

## 2014-03-11 DIAGNOSIS — M25562 Pain in left knee: Secondary | ICD-10-CM | POA: Diagnosis not present

## 2014-03-15 DIAGNOSIS — M25562 Pain in left knee: Secondary | ICD-10-CM | POA: Diagnosis not present

## 2014-03-15 DIAGNOSIS — M6281 Muscle weakness (generalized): Secondary | ICD-10-CM | POA: Diagnosis not present

## 2014-03-15 DIAGNOSIS — R2689 Other abnormalities of gait and mobility: Secondary | ICD-10-CM | POA: Diagnosis not present

## 2014-03-17 DIAGNOSIS — M6281 Muscle weakness (generalized): Secondary | ICD-10-CM | POA: Diagnosis not present

## 2014-03-17 DIAGNOSIS — R2689 Other abnormalities of gait and mobility: Secondary | ICD-10-CM | POA: Diagnosis not present

## 2014-03-17 DIAGNOSIS — M25562 Pain in left knee: Secondary | ICD-10-CM | POA: Diagnosis not present

## 2014-03-28 DIAGNOSIS — Z23 Encounter for immunization: Secondary | ICD-10-CM | POA: Diagnosis not present

## 2014-05-02 DIAGNOSIS — K219 Gastro-esophageal reflux disease without esophagitis: Secondary | ICD-10-CM | POA: Diagnosis not present

## 2014-05-02 DIAGNOSIS — E785 Hyperlipidemia, unspecified: Secondary | ICD-10-CM | POA: Diagnosis not present

## 2014-05-02 DIAGNOSIS — G4700 Insomnia, unspecified: Secondary | ICD-10-CM | POA: Diagnosis not present

## 2014-05-02 DIAGNOSIS — E039 Hypothyroidism, unspecified: Secondary | ICD-10-CM | POA: Diagnosis not present

## 2014-05-02 DIAGNOSIS — Z79899 Other long term (current) drug therapy: Secondary | ICD-10-CM | POA: Diagnosis not present

## 2014-05-02 DIAGNOSIS — G250 Essential tremor: Secondary | ICD-10-CM | POA: Diagnosis not present

## 2014-05-02 DIAGNOSIS — I10 Essential (primary) hypertension: Secondary | ICD-10-CM | POA: Diagnosis not present

## 2014-05-02 DIAGNOSIS — F329 Major depressive disorder, single episode, unspecified: Secondary | ICD-10-CM | POA: Diagnosis not present

## 2014-06-14 DIAGNOSIS — Z9221 Personal history of antineoplastic chemotherapy: Secondary | ICD-10-CM | POA: Diagnosis not present

## 2014-06-14 DIAGNOSIS — C50911 Malignant neoplasm of unspecified site of right female breast: Secondary | ICD-10-CM | POA: Diagnosis not present

## 2014-06-14 DIAGNOSIS — E785 Hyperlipidemia, unspecified: Secondary | ICD-10-CM | POA: Diagnosis not present

## 2014-06-14 DIAGNOSIS — Z888 Allergy status to other drugs, medicaments and biological substances status: Secondary | ICD-10-CM | POA: Diagnosis not present

## 2014-06-14 DIAGNOSIS — Z96652 Presence of left artificial knee joint: Secondary | ICD-10-CM | POA: Diagnosis not present

## 2014-06-14 DIAGNOSIS — Z923 Personal history of irradiation: Secondary | ICD-10-CM | POA: Diagnosis not present

## 2014-06-14 DIAGNOSIS — Z171 Estrogen receptor negative status [ER-]: Secondary | ICD-10-CM | POA: Diagnosis not present

## 2014-06-14 DIAGNOSIS — I10 Essential (primary) hypertension: Secondary | ICD-10-CM | POA: Diagnosis not present

## 2014-06-14 DIAGNOSIS — C50912 Malignant neoplasm of unspecified site of left female breast: Secondary | ICD-10-CM | POA: Diagnosis not present

## 2014-06-14 DIAGNOSIS — E079 Disorder of thyroid, unspecified: Secondary | ICD-10-CM | POA: Diagnosis not present

## 2014-06-14 DIAGNOSIS — F329 Major depressive disorder, single episode, unspecified: Secondary | ICD-10-CM | POA: Diagnosis not present

## 2014-06-14 DIAGNOSIS — Z853 Personal history of malignant neoplasm of breast: Secondary | ICD-10-CM | POA: Diagnosis not present

## 2014-06-14 DIAGNOSIS — F419 Anxiety disorder, unspecified: Secondary | ICD-10-CM | POA: Diagnosis not present

## 2014-06-14 DIAGNOSIS — Z08 Encounter for follow-up examination after completed treatment for malignant neoplasm: Secondary | ICD-10-CM | POA: Diagnosis not present

## 2014-06-14 DIAGNOSIS — G4700 Insomnia, unspecified: Secondary | ICD-10-CM | POA: Diagnosis not present

## 2014-06-14 DIAGNOSIS — Z79899 Other long term (current) drug therapy: Secondary | ICD-10-CM | POA: Diagnosis not present

## 2014-06-14 DIAGNOSIS — Z7951 Long term (current) use of inhaled steroids: Secondary | ICD-10-CM | POA: Diagnosis not present

## 2014-06-14 DIAGNOSIS — K219 Gastro-esophageal reflux disease without esophagitis: Secondary | ICD-10-CM | POA: Diagnosis not present

## 2014-06-29 DIAGNOSIS — H40013 Open angle with borderline findings, low risk, bilateral: Secondary | ICD-10-CM | POA: Diagnosis not present

## 2014-08-08 DIAGNOSIS — K219 Gastro-esophageal reflux disease without esophagitis: Secondary | ICD-10-CM | POA: Diagnosis not present

## 2014-08-08 DIAGNOSIS — K5730 Diverticulosis of large intestine without perforation or abscess without bleeding: Secondary | ICD-10-CM | POA: Diagnosis not present

## 2014-09-05 DIAGNOSIS — I10 Essential (primary) hypertension: Secondary | ICD-10-CM | POA: Diagnosis not present

## 2014-09-05 DIAGNOSIS — K219 Gastro-esophageal reflux disease without esophagitis: Secondary | ICD-10-CM | POA: Diagnosis not present

## 2014-09-05 DIAGNOSIS — E785 Hyperlipidemia, unspecified: Secondary | ICD-10-CM | POA: Diagnosis not present

## 2014-09-05 DIAGNOSIS — R609 Edema, unspecified: Secondary | ICD-10-CM | POA: Diagnosis not present

## 2014-09-05 DIAGNOSIS — M65321 Trigger finger, right index finger: Secondary | ICD-10-CM | POA: Diagnosis not present

## 2014-09-05 DIAGNOSIS — E039 Hypothyroidism, unspecified: Secondary | ICD-10-CM | POA: Diagnosis not present

## 2014-09-05 DIAGNOSIS — Z13820 Encounter for screening for osteoporosis: Secondary | ICD-10-CM | POA: Diagnosis not present

## 2014-09-06 DIAGNOSIS — Z90710 Acquired absence of both cervix and uterus: Secondary | ICD-10-CM | POA: Diagnosis not present

## 2014-09-06 DIAGNOSIS — K5732 Diverticulitis of large intestine without perforation or abscess without bleeding: Secondary | ICD-10-CM | POA: Diagnosis not present

## 2014-09-06 DIAGNOSIS — Z86010 Personal history of colonic polyps: Secondary | ICD-10-CM | POA: Diagnosis not present

## 2014-09-06 DIAGNOSIS — D126 Benign neoplasm of colon, unspecified: Secondary | ICD-10-CM | POA: Diagnosis not present

## 2014-09-06 DIAGNOSIS — R935 Abnormal findings on diagnostic imaging of other abdominal regions, including retroperitoneum: Secondary | ICD-10-CM | POA: Diagnosis not present

## 2014-09-06 DIAGNOSIS — K5730 Diverticulosis of large intestine without perforation or abscess without bleeding: Secondary | ICD-10-CM | POA: Diagnosis not present

## 2014-09-06 DIAGNOSIS — K635 Polyp of colon: Secondary | ICD-10-CM | POA: Diagnosis not present

## 2014-09-06 DIAGNOSIS — E039 Hypothyroidism, unspecified: Secondary | ICD-10-CM | POA: Diagnosis not present

## 2014-09-06 DIAGNOSIS — Z800 Family history of malignant neoplasm of digestive organs: Secondary | ICD-10-CM | POA: Diagnosis not present

## 2014-09-06 DIAGNOSIS — I10 Essential (primary) hypertension: Secondary | ICD-10-CM | POA: Diagnosis not present

## 2014-09-06 DIAGNOSIS — F329 Major depressive disorder, single episode, unspecified: Secondary | ICD-10-CM | POA: Diagnosis not present

## 2014-09-06 DIAGNOSIS — Z853 Personal history of malignant neoplasm of breast: Secondary | ICD-10-CM | POA: Diagnosis not present

## 2014-09-06 DIAGNOSIS — Z9012 Acquired absence of left breast and nipple: Secondary | ICD-10-CM | POA: Diagnosis not present

## 2014-09-08 DIAGNOSIS — E063 Autoimmune thyroiditis: Secondary | ICD-10-CM | POA: Diagnosis not present

## 2014-09-27 DIAGNOSIS — M79644 Pain in right finger(s): Secondary | ICD-10-CM | POA: Diagnosis not present

## 2014-10-31 DIAGNOSIS — J189 Pneumonia, unspecified organism: Secondary | ICD-10-CM | POA: Diagnosis not present

## 2014-11-04 DIAGNOSIS — R05 Cough: Secondary | ICD-10-CM | POA: Diagnosis not present

## 2014-11-04 DIAGNOSIS — J189 Pneumonia, unspecified organism: Secondary | ICD-10-CM | POA: Diagnosis not present

## 2014-11-04 DIAGNOSIS — R0602 Shortness of breath: Secondary | ICD-10-CM | POA: Diagnosis not present

## 2014-11-22 DIAGNOSIS — Z79899 Other long term (current) drug therapy: Secondary | ICD-10-CM | POA: Diagnosis not present

## 2014-11-22 DIAGNOSIS — E785 Hyperlipidemia, unspecified: Secondary | ICD-10-CM | POA: Diagnosis not present

## 2014-11-22 DIAGNOSIS — I10 Essential (primary) hypertension: Secondary | ICD-10-CM | POA: Diagnosis not present

## 2014-11-22 DIAGNOSIS — E039 Hypothyroidism, unspecified: Secondary | ICD-10-CM | POA: Diagnosis not present

## 2014-11-22 DIAGNOSIS — R5383 Other fatigue: Secondary | ICD-10-CM | POA: Diagnosis not present

## 2014-12-27 DIAGNOSIS — S81801A Unspecified open wound, right lower leg, initial encounter: Secondary | ICD-10-CM | POA: Diagnosis not present

## 2014-12-27 DIAGNOSIS — L03115 Cellulitis of right lower limb: Secondary | ICD-10-CM | POA: Diagnosis not present

## 2014-12-27 DIAGNOSIS — R079 Chest pain, unspecified: Secondary | ICD-10-CM | POA: Diagnosis not present

## 2014-12-28 DIAGNOSIS — R768 Other specified abnormal immunological findings in serum: Secondary | ICD-10-CM | POA: Diagnosis not present

## 2014-12-28 DIAGNOSIS — M2550 Pain in unspecified joint: Secondary | ICD-10-CM | POA: Diagnosis not present

## 2014-12-28 DIAGNOSIS — M159 Polyosteoarthritis, unspecified: Secondary | ICD-10-CM | POA: Diagnosis not present

## 2015-01-09 IMAGING — CR DG CHEST 2V
2 series · 2 of 2 positions shown · non-contrast
Comparison: 02/23/2010 and 09/30/2008

CLINICAL DATA: Hypertension.  Preop knee surgery.

EXAM:
CHEST  2 VIEW

[w chest pa]
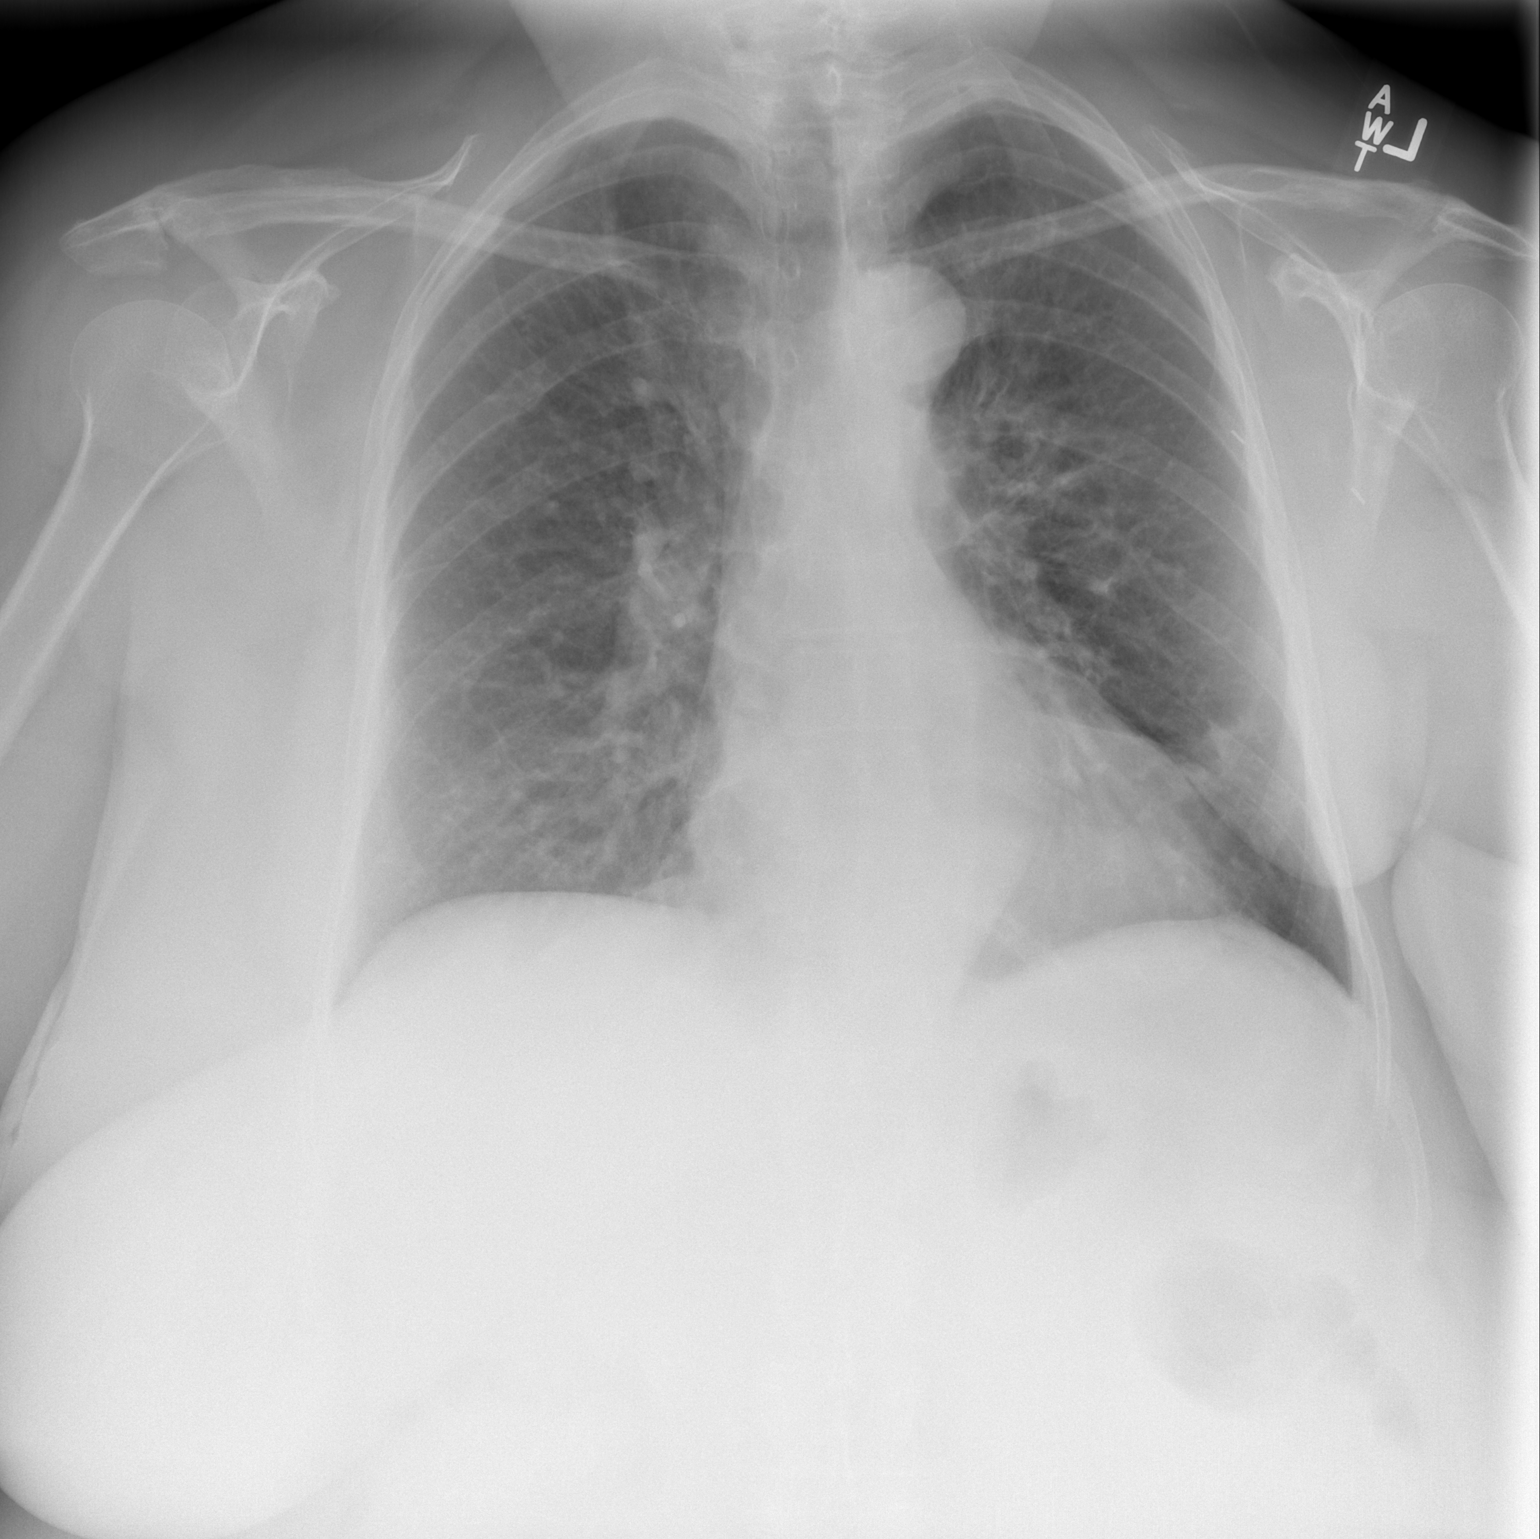

[w chest lat]
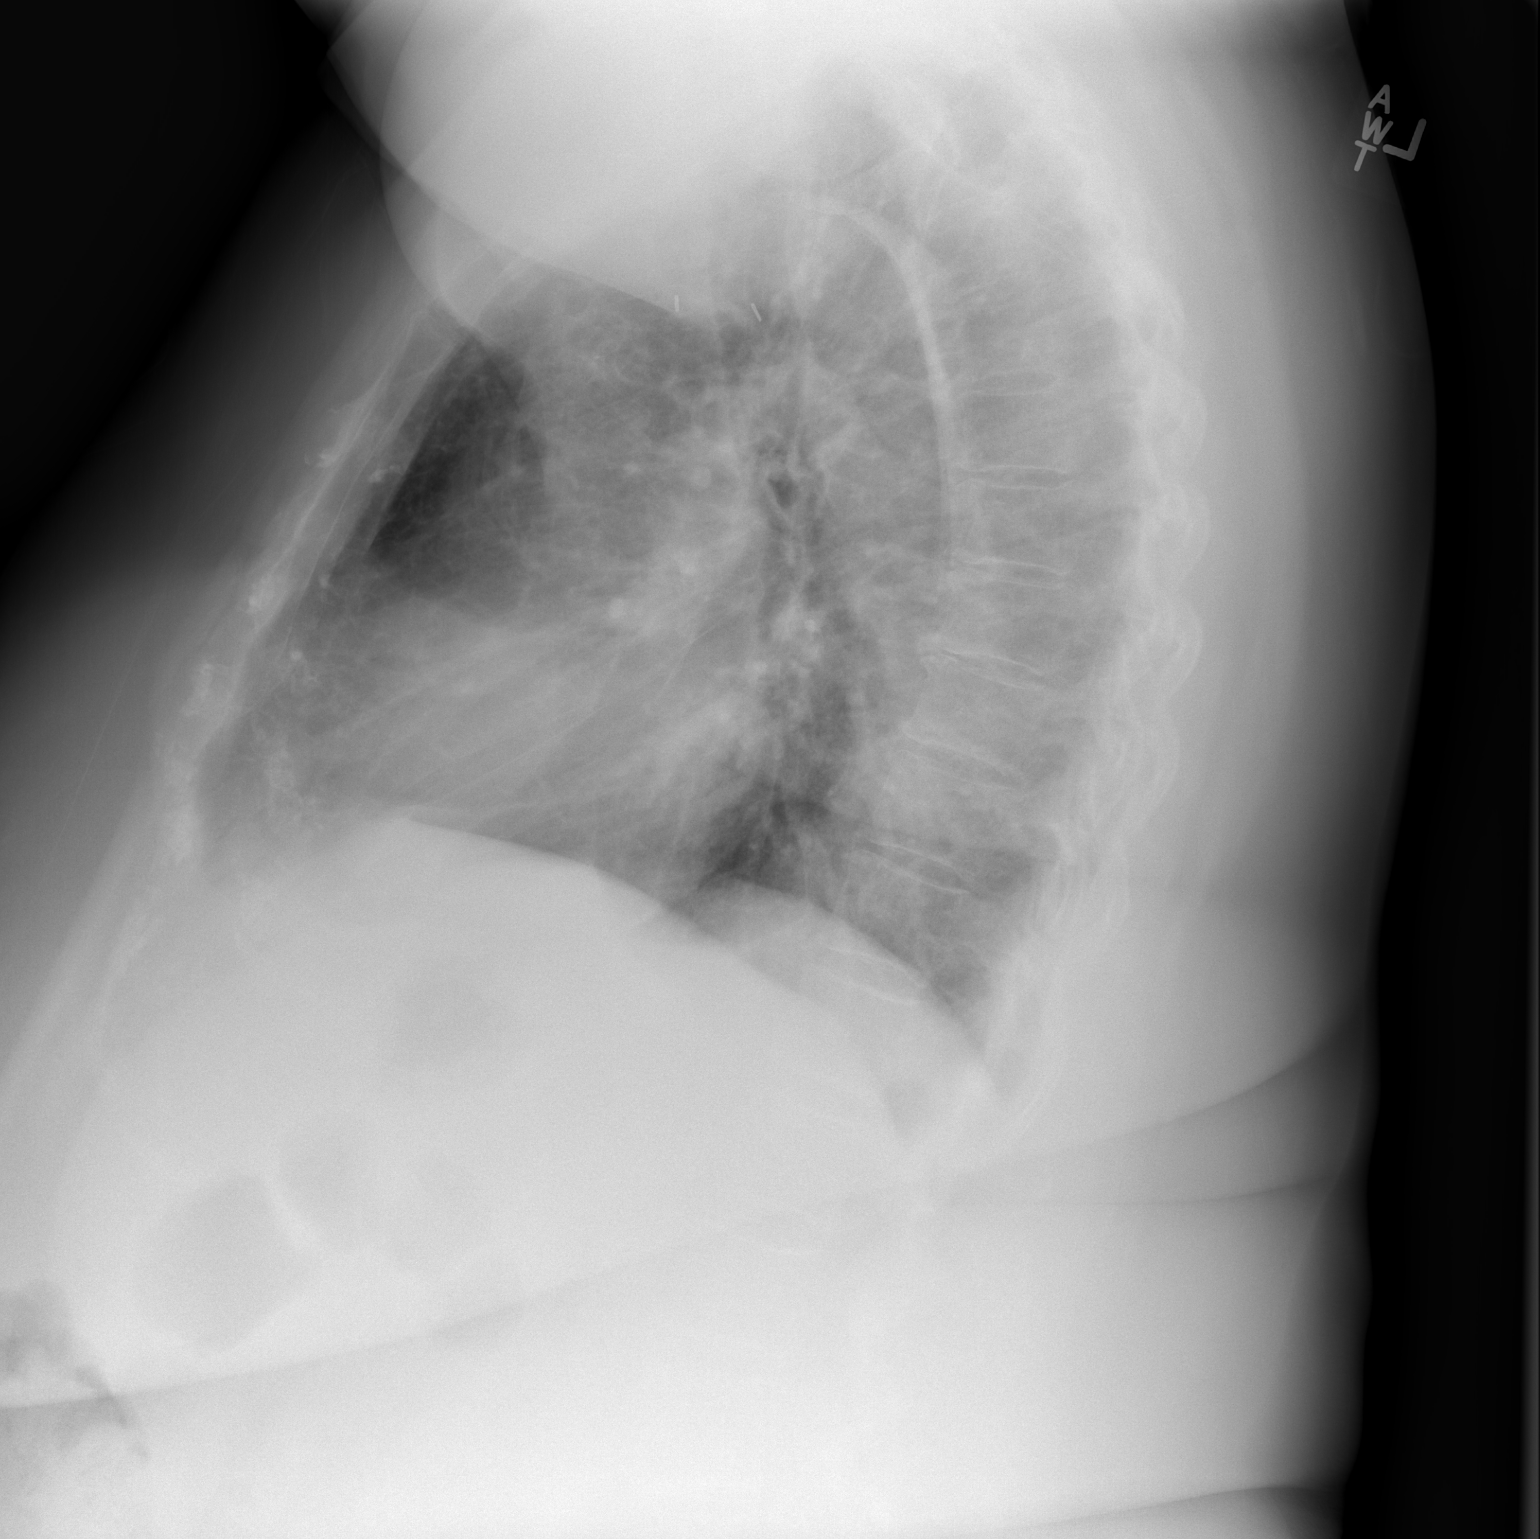

[2 of 2 positions shown; findings below may reference images not displayed]

FINDINGS: Lungs are adequately inflated without consolidation or effusion.
There is a nodular opacity over the left base which is not well seen
on the lateral film. Stable borderline cardiomegaly. Remainder the
exam is unchanged.
IMPRESSION: No active cardiopulmonary disease.

Nodular opacity over the left base. Recommend noncontrast chest CT
for further evaluation on an elective basis.

## 2015-01-10 DIAGNOSIS — R1032 Left lower quadrant pain: Secondary | ICD-10-CM | POA: Diagnosis not present

## 2015-01-13 DIAGNOSIS — N200 Calculus of kidney: Secondary | ICD-10-CM | POA: Diagnosis not present

## 2015-01-13 DIAGNOSIS — R1032 Left lower quadrant pain: Secondary | ICD-10-CM | POA: Diagnosis not present

## 2015-01-13 DIAGNOSIS — K5732 Diverticulitis of large intestine without perforation or abscess without bleeding: Secondary | ICD-10-CM | POA: Diagnosis not present

## 2015-01-13 DIAGNOSIS — N281 Cyst of kidney, acquired: Secondary | ICD-10-CM | POA: Diagnosis not present

## 2015-01-13 DIAGNOSIS — Z79899 Other long term (current) drug therapy: Secondary | ICD-10-CM | POA: Diagnosis not present

## 2015-01-24 DIAGNOSIS — M549 Dorsalgia, unspecified: Secondary | ICD-10-CM | POA: Diagnosis not present

## 2015-01-24 DIAGNOSIS — K651 Peritoneal abscess: Secondary | ICD-10-CM | POA: Diagnosis not present

## 2015-01-24 DIAGNOSIS — R1032 Left lower quadrant pain: Secondary | ICD-10-CM | POA: Diagnosis not present

## 2015-01-24 DIAGNOSIS — K5732 Diverticulitis of large intestine without perforation or abscess without bleeding: Secondary | ICD-10-CM | POA: Diagnosis not present

## 2015-01-26 DIAGNOSIS — R1084 Generalized abdominal pain: Secondary | ICD-10-CM | POA: Diagnosis not present

## 2015-01-26 DIAGNOSIS — K5732 Diverticulitis of large intestine without perforation or abscess without bleeding: Secondary | ICD-10-CM | POA: Diagnosis not present

## 2015-02-07 DIAGNOSIS — I10 Essential (primary) hypertension: Secondary | ICD-10-CM | POA: Diagnosis not present

## 2015-02-07 DIAGNOSIS — F329 Major depressive disorder, single episode, unspecified: Secondary | ICD-10-CM | POA: Diagnosis not present

## 2015-02-07 DIAGNOSIS — E039 Hypothyroidism, unspecified: Secondary | ICD-10-CM | POA: Diagnosis not present

## 2015-02-07 DIAGNOSIS — K5730 Diverticulosis of large intestine without perforation or abscess without bleeding: Secondary | ICD-10-CM | POA: Diagnosis not present

## 2015-02-08 DIAGNOSIS — Z96652 Presence of left artificial knee joint: Secondary | ICD-10-CM | POA: Diagnosis not present

## 2015-02-08 DIAGNOSIS — Z471 Aftercare following joint replacement surgery: Secondary | ICD-10-CM | POA: Diagnosis not present

## 2015-02-15 DIAGNOSIS — K5792 Diverticulitis of intestine, part unspecified, without perforation or abscess without bleeding: Secondary | ICD-10-CM | POA: Diagnosis not present

## 2015-02-15 DIAGNOSIS — R1032 Left lower quadrant pain: Secondary | ICD-10-CM | POA: Diagnosis not present

## 2015-03-13 DIAGNOSIS — K5900 Constipation, unspecified: Secondary | ICD-10-CM | POA: Diagnosis not present

## 2015-03-13 DIAGNOSIS — K219 Gastro-esophageal reflux disease without esophagitis: Secondary | ICD-10-CM | POA: Diagnosis not present

## 2015-03-13 DIAGNOSIS — R1013 Epigastric pain: Secondary | ICD-10-CM | POA: Diagnosis not present

## 2015-03-17 DIAGNOSIS — Z23 Encounter for immunization: Secondary | ICD-10-CM | POA: Diagnosis not present

## 2015-04-10 DIAGNOSIS — R1032 Left lower quadrant pain: Secondary | ICD-10-CM | POA: Diagnosis not present

## 2015-04-10 DIAGNOSIS — E785 Hyperlipidemia, unspecified: Secondary | ICD-10-CM | POA: Diagnosis not present

## 2015-04-10 DIAGNOSIS — E039 Hypothyroidism, unspecified: Secondary | ICD-10-CM | POA: Diagnosis not present

## 2015-04-10 DIAGNOSIS — I10 Essential (primary) hypertension: Secondary | ICD-10-CM | POA: Diagnosis not present

## 2015-04-10 DIAGNOSIS — Z79899 Other long term (current) drug therapy: Secondary | ICD-10-CM | POA: Diagnosis not present

## 2015-05-09 DIAGNOSIS — K580 Irritable bowel syndrome with diarrhea: Secondary | ICD-10-CM | POA: Diagnosis not present

## 2015-05-09 DIAGNOSIS — K5732 Diverticulitis of large intestine without perforation or abscess without bleeding: Secondary | ICD-10-CM | POA: Diagnosis not present

## 2015-06-01 DIAGNOSIS — M79606 Pain in leg, unspecified: Secondary | ICD-10-CM | POA: Diagnosis not present

## 2015-06-01 DIAGNOSIS — R600 Localized edema: Secondary | ICD-10-CM | POA: Diagnosis not present

## 2015-06-02 DIAGNOSIS — R7989 Other specified abnormal findings of blood chemistry: Secondary | ICD-10-CM | POA: Diagnosis not present

## 2015-06-02 DIAGNOSIS — L03115 Cellulitis of right lower limb: Secondary | ICD-10-CM | POA: Diagnosis not present

## 2015-06-02 DIAGNOSIS — M7989 Other specified soft tissue disorders: Secondary | ICD-10-CM | POA: Diagnosis not present

## 2015-06-02 DIAGNOSIS — L03116 Cellulitis of left lower limb: Secondary | ICD-10-CM | POA: Diagnosis not present

## 2015-06-20 DIAGNOSIS — F329 Major depressive disorder, single episode, unspecified: Secondary | ICD-10-CM | POA: Diagnosis not present

## 2015-06-20 DIAGNOSIS — E785 Hyperlipidemia, unspecified: Secondary | ICD-10-CM | POA: Diagnosis not present

## 2015-06-20 DIAGNOSIS — E7800 Pure hypercholesterolemia, unspecified: Secondary | ICD-10-CM | POA: Diagnosis not present

## 2015-06-20 DIAGNOSIS — C50912 Malignant neoplasm of unspecified site of left female breast: Secondary | ICD-10-CM | POA: Diagnosis not present

## 2015-06-20 DIAGNOSIS — Z9221 Personal history of antineoplastic chemotherapy: Secondary | ICD-10-CM | POA: Diagnosis not present

## 2015-06-20 DIAGNOSIS — K219 Gastro-esophageal reflux disease without esophagitis: Secondary | ICD-10-CM | POA: Diagnosis not present

## 2015-06-20 DIAGNOSIS — N6489 Other specified disorders of breast: Secondary | ICD-10-CM | POA: Diagnosis not present

## 2015-06-20 DIAGNOSIS — C50812 Malignant neoplasm of overlapping sites of left female breast: Secondary | ICD-10-CM | POA: Diagnosis not present

## 2015-06-20 DIAGNOSIS — Z171 Estrogen receptor negative status [ER-]: Secondary | ICD-10-CM | POA: Diagnosis not present

## 2015-06-20 DIAGNOSIS — I10 Essential (primary) hypertension: Secondary | ICD-10-CM | POA: Diagnosis not present

## 2015-06-20 DIAGNOSIS — R928 Other abnormal and inconclusive findings on diagnostic imaging of breast: Secondary | ICD-10-CM | POA: Diagnosis not present

## 2015-07-17 DIAGNOSIS — Z96652 Presence of left artificial knee joint: Secondary | ICD-10-CM | POA: Diagnosis not present

## 2015-07-17 DIAGNOSIS — Z471 Aftercare following joint replacement surgery: Secondary | ICD-10-CM | POA: Diagnosis not present

## 2015-07-17 DIAGNOSIS — M25862 Other specified joint disorders, left knee: Secondary | ICD-10-CM | POA: Diagnosis not present

## 2015-07-17 DIAGNOSIS — M25562 Pain in left knee: Secondary | ICD-10-CM | POA: Diagnosis not present

## 2015-07-22 DIAGNOSIS — M1811 Unilateral primary osteoarthritis of first carpometacarpal joint, right hand: Secondary | ICD-10-CM | POA: Diagnosis not present

## 2015-07-22 DIAGNOSIS — M65841 Other synovitis and tenosynovitis, right hand: Secondary | ICD-10-CM | POA: Diagnosis not present

## 2015-07-22 DIAGNOSIS — M79644 Pain in right finger(s): Secondary | ICD-10-CM | POA: Diagnosis not present

## 2015-07-25 DIAGNOSIS — Z96652 Presence of left artificial knee joint: Secondary | ICD-10-CM | POA: Diagnosis not present

## 2015-07-25 DIAGNOSIS — M6281 Muscle weakness (generalized): Secondary | ICD-10-CM | POA: Diagnosis not present

## 2015-07-27 DIAGNOSIS — Z96652 Presence of left artificial knee joint: Secondary | ICD-10-CM | POA: Diagnosis not present

## 2015-07-27 DIAGNOSIS — M6281 Muscle weakness (generalized): Secondary | ICD-10-CM | POA: Diagnosis not present

## 2015-08-01 DIAGNOSIS — M6281 Muscle weakness (generalized): Secondary | ICD-10-CM | POA: Diagnosis not present

## 2015-08-01 DIAGNOSIS — Z96652 Presence of left artificial knee joint: Secondary | ICD-10-CM | POA: Diagnosis not present

## 2015-08-08 DIAGNOSIS — M6281 Muscle weakness (generalized): Secondary | ICD-10-CM | POA: Diagnosis not present

## 2015-08-08 DIAGNOSIS — Z96652 Presence of left artificial knee joint: Secondary | ICD-10-CM | POA: Diagnosis not present

## 2015-08-10 DIAGNOSIS — Z96652 Presence of left artificial knee joint: Secondary | ICD-10-CM | POA: Diagnosis not present

## 2015-08-10 DIAGNOSIS — M6281 Muscle weakness (generalized): Secondary | ICD-10-CM | POA: Diagnosis not present

## 2015-08-11 DIAGNOSIS — E039 Hypothyroidism, unspecified: Secondary | ICD-10-CM | POA: Diagnosis not present

## 2015-08-11 DIAGNOSIS — I10 Essential (primary) hypertension: Secondary | ICD-10-CM | POA: Diagnosis not present

## 2015-08-11 DIAGNOSIS — E785 Hyperlipidemia, unspecified: Secondary | ICD-10-CM | POA: Diagnosis not present

## 2015-08-11 DIAGNOSIS — K219 Gastro-esophageal reflux disease without esophagitis: Secondary | ICD-10-CM | POA: Diagnosis not present

## 2015-08-11 DIAGNOSIS — M7989 Other specified soft tissue disorders: Secondary | ICD-10-CM | POA: Diagnosis not present

## 2015-08-11 DIAGNOSIS — R7301 Impaired fasting glucose: Secondary | ICD-10-CM | POA: Diagnosis not present

## 2015-08-11 DIAGNOSIS — Z79899 Other long term (current) drug therapy: Secondary | ICD-10-CM | POA: Diagnosis not present

## 2015-08-15 DIAGNOSIS — M6281 Muscle weakness (generalized): Secondary | ICD-10-CM | POA: Diagnosis not present

## 2015-08-15 DIAGNOSIS — Z96652 Presence of left artificial knee joint: Secondary | ICD-10-CM | POA: Diagnosis not present

## 2015-08-17 DIAGNOSIS — M6281 Muscle weakness (generalized): Secondary | ICD-10-CM | POA: Diagnosis not present

## 2015-08-17 DIAGNOSIS — Z96652 Presence of left artificial knee joint: Secondary | ICD-10-CM | POA: Diagnosis not present

## 2015-08-24 DIAGNOSIS — Z96652 Presence of left artificial knee joint: Secondary | ICD-10-CM | POA: Diagnosis not present

## 2015-08-24 DIAGNOSIS — M6281 Muscle weakness (generalized): Secondary | ICD-10-CM | POA: Diagnosis not present

## 2015-08-30 DIAGNOSIS — Z96652 Presence of left artificial knee joint: Secondary | ICD-10-CM | POA: Diagnosis not present

## 2015-08-30 DIAGNOSIS — Z471 Aftercare following joint replacement surgery: Secondary | ICD-10-CM | POA: Diagnosis not present

## 2015-09-04 DIAGNOSIS — M1811 Unilateral primary osteoarthritis of first carpometacarpal joint, right hand: Secondary | ICD-10-CM | POA: Diagnosis not present

## 2015-09-04 DIAGNOSIS — M65841 Other synovitis and tenosynovitis, right hand: Secondary | ICD-10-CM | POA: Diagnosis not present

## 2015-09-25 DIAGNOSIS — N289 Disorder of kidney and ureter, unspecified: Secondary | ICD-10-CM | POA: Diagnosis not present

## 2015-09-25 DIAGNOSIS — R1030 Lower abdominal pain, unspecified: Secondary | ICD-10-CM | POA: Diagnosis not present

## 2015-09-25 DIAGNOSIS — R309 Painful micturition, unspecified: Secondary | ICD-10-CM | POA: Diagnosis not present

## 2015-09-26 DIAGNOSIS — R194 Change in bowel habit: Secondary | ICD-10-CM | POA: Diagnosis not present

## 2015-09-26 DIAGNOSIS — R109 Unspecified abdominal pain: Secondary | ICD-10-CM | POA: Diagnosis not present

## 2015-10-09 DIAGNOSIS — N281 Cyst of kidney, acquired: Secondary | ICD-10-CM | POA: Diagnosis not present

## 2015-10-09 DIAGNOSIS — N179 Acute kidney failure, unspecified: Secondary | ICD-10-CM | POA: Diagnosis not present

## 2015-10-09 DIAGNOSIS — N200 Calculus of kidney: Secondary | ICD-10-CM | POA: Diagnosis not present

## 2015-11-20 DIAGNOSIS — R194 Change in bowel habit: Secondary | ICD-10-CM | POA: Diagnosis not present

## 2015-11-20 DIAGNOSIS — R1084 Generalized abdominal pain: Secondary | ICD-10-CM | POA: Diagnosis not present

## 2015-12-11 DIAGNOSIS — I10 Essential (primary) hypertension: Secondary | ICD-10-CM | POA: Diagnosis not present

## 2015-12-11 DIAGNOSIS — K219 Gastro-esophageal reflux disease without esophagitis: Secondary | ICD-10-CM | POA: Diagnosis not present

## 2015-12-11 DIAGNOSIS — E039 Hypothyroidism, unspecified: Secondary | ICD-10-CM | POA: Diagnosis not present

## 2015-12-11 DIAGNOSIS — G4700 Insomnia, unspecified: Secondary | ICD-10-CM | POA: Diagnosis not present

## 2015-12-11 DIAGNOSIS — F329 Major depressive disorder, single episode, unspecified: Secondary | ICD-10-CM | POA: Diagnosis not present

## 2015-12-11 DIAGNOSIS — Z79899 Other long term (current) drug therapy: Secondary | ICD-10-CM | POA: Diagnosis not present

## 2015-12-11 DIAGNOSIS — E785 Hyperlipidemia, unspecified: Secondary | ICD-10-CM | POA: Diagnosis not present

## 2015-12-19 DIAGNOSIS — L723 Sebaceous cyst: Secondary | ICD-10-CM | POA: Diagnosis not present

## 2015-12-19 DIAGNOSIS — C50912 Malignant neoplasm of unspecified site of left female breast: Secondary | ICD-10-CM | POA: Diagnosis not present

## 2016-01-30 ENCOUNTER — Other Ambulatory Visit: Payer: Self-pay

## 2016-02-27 DIAGNOSIS — I8310 Varicose veins of unspecified lower extremity with inflammation: Secondary | ICD-10-CM | POA: Diagnosis not present

## 2016-02-27 DIAGNOSIS — L219 Seborrheic dermatitis, unspecified: Secondary | ICD-10-CM | POA: Diagnosis not present

## 2016-02-27 DIAGNOSIS — L02219 Cutaneous abscess of trunk, unspecified: Secondary | ICD-10-CM | POA: Diagnosis not present

## 2016-03-06 DIAGNOSIS — Z23 Encounter for immunization: Secondary | ICD-10-CM | POA: Diagnosis not present

## 2016-04-02 DIAGNOSIS — Z79899 Other long term (current) drug therapy: Secondary | ICD-10-CM | POA: Diagnosis not present

## 2016-04-02 DIAGNOSIS — Z23 Encounter for immunization: Secondary | ICD-10-CM | POA: Diagnosis not present

## 2016-04-02 DIAGNOSIS — R0602 Shortness of breath: Secondary | ICD-10-CM | POA: Diagnosis not present

## 2016-04-02 DIAGNOSIS — E039 Hypothyroidism, unspecified: Secondary | ICD-10-CM | POA: Diagnosis not present

## 2016-04-02 DIAGNOSIS — Z0001 Encounter for general adult medical examination with abnormal findings: Secondary | ICD-10-CM | POA: Diagnosis not present

## 2016-04-02 DIAGNOSIS — Z1389 Encounter for screening for other disorder: Secondary | ICD-10-CM | POA: Diagnosis not present

## 2016-04-02 DIAGNOSIS — N3941 Urge incontinence: Secondary | ICD-10-CM | POA: Diagnosis not present

## 2016-04-02 DIAGNOSIS — E785 Hyperlipidemia, unspecified: Secondary | ICD-10-CM | POA: Diagnosis not present

## 2016-04-04 DIAGNOSIS — Z23 Encounter for immunization: Secondary | ICD-10-CM | POA: Diagnosis not present

## 2016-04-09 DIAGNOSIS — Z0001 Encounter for general adult medical examination with abnormal findings: Secondary | ICD-10-CM | POA: Diagnosis not present

## 2016-04-09 DIAGNOSIS — R0600 Dyspnea, unspecified: Secondary | ICD-10-CM | POA: Diagnosis not present

## 2016-04-09 DIAGNOSIS — J988 Other specified respiratory disorders: Secondary | ICD-10-CM | POA: Diagnosis not present

## 2016-04-09 DIAGNOSIS — R0602 Shortness of breath: Secondary | ICD-10-CM | POA: Diagnosis not present

## 2016-04-15 DIAGNOSIS — R0602 Shortness of breath: Secondary | ICD-10-CM | POA: Diagnosis not present

## 2016-04-15 DIAGNOSIS — R7989 Other specified abnormal findings of blood chemistry: Secondary | ICD-10-CM | POA: Diagnosis not present

## 2016-06-27 DIAGNOSIS — N6310 Unspecified lump in the right breast, unspecified quadrant: Secondary | ICD-10-CM | POA: Diagnosis not present

## 2016-06-27 DIAGNOSIS — N6311 Unspecified lump in the right breast, upper outer quadrant: Secondary | ICD-10-CM | POA: Diagnosis not present

## 2016-06-27 DIAGNOSIS — Z171 Estrogen receptor negative status [ER-]: Secondary | ICD-10-CM | POA: Diagnosis not present

## 2016-06-27 DIAGNOSIS — Z23 Encounter for immunization: Secondary | ICD-10-CM | POA: Diagnosis not present

## 2016-06-27 DIAGNOSIS — C50912 Malignant neoplasm of unspecified site of left female breast: Secondary | ICD-10-CM | POA: Diagnosis not present

## 2016-06-27 DIAGNOSIS — Z9012 Acquired absence of left breast and nipple: Secondary | ICD-10-CM | POA: Diagnosis not present

## 2016-06-27 DIAGNOSIS — R928 Other abnormal and inconclusive findings on diagnostic imaging of breast: Secondary | ICD-10-CM | POA: Diagnosis not present

## 2016-06-27 DIAGNOSIS — N6321 Unspecified lump in the left breast, upper outer quadrant: Secondary | ICD-10-CM | POA: Diagnosis not present

## 2016-07-05 DIAGNOSIS — C50911 Malignant neoplasm of unspecified site of right female breast: Secondary | ICD-10-CM | POA: Diagnosis not present

## 2016-07-05 DIAGNOSIS — C50411 Malignant neoplasm of upper-outer quadrant of right female breast: Secondary | ICD-10-CM | POA: Diagnosis not present

## 2016-07-05 DIAGNOSIS — Z170 Estrogen receptor positive status [ER+]: Secondary | ICD-10-CM | POA: Diagnosis not present

## 2016-07-17 DIAGNOSIS — Z9012 Acquired absence of left breast and nipple: Secondary | ICD-10-CM | POA: Diagnosis not present

## 2016-07-17 DIAGNOSIS — Z923 Personal history of irradiation: Secondary | ICD-10-CM | POA: Diagnosis not present

## 2016-07-17 DIAGNOSIS — Z9221 Personal history of antineoplastic chemotherapy: Secondary | ICD-10-CM | POA: Diagnosis not present

## 2016-07-17 DIAGNOSIS — C50911 Malignant neoplasm of unspecified site of right female breast: Secondary | ICD-10-CM | POA: Diagnosis not present

## 2016-07-31 DIAGNOSIS — E2839 Other primary ovarian failure: Secondary | ICD-10-CM | POA: Diagnosis not present

## 2016-07-31 DIAGNOSIS — E785 Hyperlipidemia, unspecified: Secondary | ICD-10-CM | POA: Diagnosis not present

## 2016-07-31 DIAGNOSIS — C50911 Malignant neoplasm of unspecified site of right female breast: Secondary | ICD-10-CM | POA: Diagnosis not present

## 2016-07-31 DIAGNOSIS — J4520 Mild intermittent asthma, uncomplicated: Secondary | ICD-10-CM | POA: Diagnosis not present

## 2016-07-31 DIAGNOSIS — I10 Essential (primary) hypertension: Secondary | ICD-10-CM | POA: Diagnosis not present

## 2016-07-31 DIAGNOSIS — E039 Hypothyroidism, unspecified: Secondary | ICD-10-CM | POA: Diagnosis not present

## 2016-08-01 HISTORY — PX: MASTECTOMY: SHX3

## 2016-08-02 DIAGNOSIS — J45909 Unspecified asthma, uncomplicated: Secondary | ICD-10-CM

## 2016-08-02 DIAGNOSIS — M1991 Primary osteoarthritis, unspecified site: Secondary | ICD-10-CM | POA: Diagnosis not present

## 2016-08-02 DIAGNOSIS — Z91048 Other nonmedicinal substance allergy status: Secondary | ICD-10-CM | POA: Diagnosis not present

## 2016-08-02 DIAGNOSIS — J302 Other seasonal allergic rhinitis: Secondary | ICD-10-CM | POA: Diagnosis not present

## 2016-08-02 DIAGNOSIS — I10 Essential (primary) hypertension: Secondary | ICD-10-CM | POA: Diagnosis not present

## 2016-08-02 DIAGNOSIS — E7800 Pure hypercholesterolemia, unspecified: Secondary | ICD-10-CM | POA: Diagnosis not present

## 2016-08-02 DIAGNOSIS — F329 Major depressive disorder, single episode, unspecified: Secondary | ICD-10-CM | POA: Diagnosis not present

## 2016-08-02 DIAGNOSIS — K219 Gastro-esophageal reflux disease without esophagitis: Secondary | ICD-10-CM | POA: Diagnosis not present

## 2016-08-02 DIAGNOSIS — C50911 Malignant neoplasm of unspecified site of right female breast: Secondary | ICD-10-CM | POA: Diagnosis not present

## 2016-08-02 DIAGNOSIS — E063 Autoimmune thyroiditis: Secondary | ICD-10-CM | POA: Diagnosis not present

## 2016-08-02 DIAGNOSIS — Z79899 Other long term (current) drug therapy: Secondary | ICD-10-CM | POA: Diagnosis not present

## 2016-08-02 DIAGNOSIS — Z6841 Body Mass Index (BMI) 40.0 and over, adult: Secondary | ICD-10-CM | POA: Diagnosis not present

## 2016-08-02 DIAGNOSIS — H9190 Unspecified hearing loss, unspecified ear: Secondary | ICD-10-CM | POA: Diagnosis not present

## 2016-08-02 DIAGNOSIS — Z7982 Long term (current) use of aspirin: Secondary | ICD-10-CM | POA: Diagnosis not present

## 2016-08-02 DIAGNOSIS — F419 Anxiety disorder, unspecified: Secondary | ICD-10-CM | POA: Diagnosis not present

## 2016-08-02 DIAGNOSIS — Z853 Personal history of malignant neoplasm of breast: Secondary | ICD-10-CM | POA: Diagnosis not present

## 2016-08-02 DIAGNOSIS — Z9012 Acquired absence of left breast and nipple: Secondary | ICD-10-CM | POA: Diagnosis not present

## 2016-08-02 DIAGNOSIS — R9431 Abnormal electrocardiogram [ECG] [EKG]: Secondary | ICD-10-CM | POA: Diagnosis not present

## 2016-08-02 DIAGNOSIS — E785 Hyperlipidemia, unspecified: Secondary | ICD-10-CM | POA: Diagnosis not present

## 2016-08-02 DIAGNOSIS — Z888 Allergy status to other drugs, medicaments and biological substances status: Secondary | ICD-10-CM | POA: Diagnosis not present

## 2016-08-02 DIAGNOSIS — Z9221 Personal history of antineoplastic chemotherapy: Secondary | ICD-10-CM | POA: Diagnosis not present

## 2016-08-02 DIAGNOSIS — E039 Hypothyroidism, unspecified: Secondary | ICD-10-CM | POA: Diagnosis not present

## 2016-08-02 DIAGNOSIS — Z923 Personal history of irradiation: Secondary | ICD-10-CM | POA: Diagnosis not present

## 2016-08-02 HISTORY — DX: Unspecified asthma, uncomplicated: J45.909

## 2016-08-06 DIAGNOSIS — H5213 Myopia, bilateral: Secondary | ICD-10-CM | POA: Diagnosis not present

## 2016-08-06 DIAGNOSIS — H2513 Age-related nuclear cataract, bilateral: Secondary | ICD-10-CM | POA: Diagnosis not present

## 2016-08-06 DIAGNOSIS — H40013 Open angle with borderline findings, low risk, bilateral: Secondary | ICD-10-CM | POA: Diagnosis not present

## 2016-08-13 DIAGNOSIS — C50411 Malignant neoplasm of upper-outer quadrant of right female breast: Secondary | ICD-10-CM | POA: Diagnosis not present

## 2016-08-13 DIAGNOSIS — E785 Hyperlipidemia, unspecified: Secondary | ICD-10-CM | POA: Diagnosis not present

## 2016-08-13 DIAGNOSIS — D0511 Intraductal carcinoma in situ of right breast: Secondary | ICD-10-CM | POA: Diagnosis not present

## 2016-08-13 DIAGNOSIS — K219 Gastro-esophageal reflux disease without esophagitis: Secondary | ICD-10-CM | POA: Diagnosis not present

## 2016-08-13 DIAGNOSIS — G8918 Other acute postprocedural pain: Secondary | ICD-10-CM | POA: Diagnosis not present

## 2016-08-13 DIAGNOSIS — E039 Hypothyroidism, unspecified: Secondary | ICD-10-CM | POA: Diagnosis not present

## 2016-08-13 DIAGNOSIS — C50911 Malignant neoplasm of unspecified site of right female breast: Secondary | ICD-10-CM | POA: Diagnosis not present

## 2016-08-13 DIAGNOSIS — J302 Other seasonal allergic rhinitis: Secondary | ICD-10-CM | POA: Diagnosis not present

## 2016-08-13 DIAGNOSIS — J45909 Unspecified asthma, uncomplicated: Secondary | ICD-10-CM | POA: Diagnosis not present

## 2016-08-13 DIAGNOSIS — Z170 Estrogen receptor positive status [ER+]: Secondary | ICD-10-CM | POA: Diagnosis not present

## 2016-08-14 DIAGNOSIS — J302 Other seasonal allergic rhinitis: Secondary | ICD-10-CM | POA: Diagnosis not present

## 2016-08-14 DIAGNOSIS — E785 Hyperlipidemia, unspecified: Secondary | ICD-10-CM | POA: Diagnosis not present

## 2016-08-14 DIAGNOSIS — E039 Hypothyroidism, unspecified: Secondary | ICD-10-CM | POA: Diagnosis not present

## 2016-08-14 DIAGNOSIS — K219 Gastro-esophageal reflux disease without esophagitis: Secondary | ICD-10-CM | POA: Diagnosis not present

## 2016-08-14 DIAGNOSIS — C50911 Malignant neoplasm of unspecified site of right female breast: Secondary | ICD-10-CM | POA: Diagnosis not present

## 2016-08-14 DIAGNOSIS — J45909 Unspecified asthma, uncomplicated: Secondary | ICD-10-CM | POA: Diagnosis not present

## 2016-08-21 DIAGNOSIS — I10 Essential (primary) hypertension: Secondary | ICD-10-CM | POA: Diagnosis not present

## 2016-08-21 DIAGNOSIS — E7800 Pure hypercholesterolemia, unspecified: Secondary | ICD-10-CM | POA: Diagnosis not present

## 2016-08-21 DIAGNOSIS — Z483 Aftercare following surgery for neoplasm: Secondary | ICD-10-CM | POA: Diagnosis not present

## 2016-08-21 DIAGNOSIS — Z170 Estrogen receptor positive status [ER+]: Secondary | ICD-10-CM | POA: Diagnosis not present

## 2016-08-21 DIAGNOSIS — Z853 Personal history of malignant neoplasm of breast: Secondary | ICD-10-CM | POA: Diagnosis not present

## 2016-08-21 DIAGNOSIS — E785 Hyperlipidemia, unspecified: Secondary | ICD-10-CM | POA: Diagnosis not present

## 2016-08-21 DIAGNOSIS — C50911 Malignant neoplasm of unspecified site of right female breast: Secondary | ICD-10-CM | POA: Diagnosis not present

## 2016-08-21 DIAGNOSIS — Z9013 Acquired absence of bilateral breasts and nipples: Secondary | ICD-10-CM | POA: Diagnosis not present

## 2016-08-21 DIAGNOSIS — J45909 Unspecified asthma, uncomplicated: Secondary | ICD-10-CM | POA: Diagnosis not present

## 2016-08-21 DIAGNOSIS — Z888 Allergy status to other drugs, medicaments and biological substances status: Secondary | ICD-10-CM | POA: Diagnosis not present

## 2016-08-21 DIAGNOSIS — Z7982 Long term (current) use of aspirin: Secondary | ICD-10-CM | POA: Diagnosis not present

## 2016-08-21 DIAGNOSIS — Z79899 Other long term (current) drug therapy: Secondary | ICD-10-CM | POA: Diagnosis not present

## 2016-09-02 DIAGNOSIS — Z483 Aftercare following surgery for neoplasm: Secondary | ICD-10-CM | POA: Diagnosis not present

## 2016-09-02 DIAGNOSIS — C50911 Malignant neoplasm of unspecified site of right female breast: Secondary | ICD-10-CM | POA: Diagnosis not present

## 2016-09-02 DIAGNOSIS — Z9012 Acquired absence of left breast and nipple: Secondary | ICD-10-CM | POA: Diagnosis not present

## 2016-09-02 DIAGNOSIS — Z4682 Encounter for fitting and adjustment of non-vascular catheter: Secondary | ICD-10-CM | POA: Diagnosis not present

## 2016-09-02 DIAGNOSIS — Z9011 Acquired absence of right breast and nipple: Secondary | ICD-10-CM | POA: Diagnosis not present

## 2016-09-02 DIAGNOSIS — Z9221 Personal history of antineoplastic chemotherapy: Secondary | ICD-10-CM | POA: Diagnosis not present

## 2016-09-12 DIAGNOSIS — Z79890 Hormone replacement therapy (postmenopausal): Secondary | ICD-10-CM | POA: Diagnosis not present

## 2016-09-12 DIAGNOSIS — Z923 Personal history of irradiation: Secondary | ICD-10-CM | POA: Diagnosis not present

## 2016-09-12 DIAGNOSIS — Z9011 Acquired absence of right breast and nipple: Secondary | ICD-10-CM | POA: Diagnosis not present

## 2016-09-12 DIAGNOSIS — Z853 Personal history of malignant neoplasm of breast: Secondary | ICD-10-CM | POA: Diagnosis not present

## 2016-09-12 DIAGNOSIS — Z8639 Personal history of other endocrine, nutritional and metabolic disease: Secondary | ICD-10-CM | POA: Insufficient documentation

## 2016-09-12 DIAGNOSIS — E063 Autoimmune thyroiditis: Secondary | ICD-10-CM | POA: Diagnosis not present

## 2016-09-12 HISTORY — DX: Personal history of other endocrine, nutritional and metabolic disease: Z86.39

## 2016-09-16 DIAGNOSIS — Z853 Personal history of malignant neoplasm of breast: Secondary | ICD-10-CM | POA: Diagnosis not present

## 2016-09-16 DIAGNOSIS — C50911 Malignant neoplasm of unspecified site of right female breast: Secondary | ICD-10-CM | POA: Diagnosis not present

## 2016-09-19 DIAGNOSIS — M7989 Other specified soft tissue disorders: Secondary | ICD-10-CM | POA: Diagnosis not present

## 2016-09-19 DIAGNOSIS — L03119 Cellulitis of unspecified part of limb: Secondary | ICD-10-CM | POA: Diagnosis not present

## 2016-09-30 DIAGNOSIS — C50911 Malignant neoplasm of unspecified site of right female breast: Secondary | ICD-10-CM | POA: Diagnosis not present

## 2016-10-03 DIAGNOSIS — M7989 Other specified soft tissue disorders: Secondary | ICD-10-CM | POA: Diagnosis not present

## 2016-10-03 DIAGNOSIS — E039 Hypothyroidism, unspecified: Secondary | ICD-10-CM | POA: Diagnosis not present

## 2016-10-03 DIAGNOSIS — E785 Hyperlipidemia, unspecified: Secondary | ICD-10-CM | POA: Diagnosis not present

## 2016-10-03 DIAGNOSIS — I10 Essential (primary) hypertension: Secondary | ICD-10-CM | POA: Diagnosis not present

## 2016-10-03 DIAGNOSIS — K219 Gastro-esophageal reflux disease without esophagitis: Secondary | ICD-10-CM | POA: Diagnosis not present

## 2016-10-03 DIAGNOSIS — J4520 Mild intermittent asthma, uncomplicated: Secondary | ICD-10-CM | POA: Diagnosis not present

## 2016-10-03 DIAGNOSIS — Z189 Retained foreign body fragments, unspecified material: Secondary | ICD-10-CM | POA: Diagnosis not present

## 2016-10-10 DIAGNOSIS — E785 Hyperlipidemia, unspecified: Secondary | ICD-10-CM | POA: Diagnosis not present

## 2016-10-10 DIAGNOSIS — I10 Essential (primary) hypertension: Secondary | ICD-10-CM | POA: Diagnosis not present

## 2016-10-10 DIAGNOSIS — E039 Hypothyroidism, unspecified: Secondary | ICD-10-CM | POA: Diagnosis not present

## 2016-10-10 DIAGNOSIS — Z79899 Other long term (current) drug therapy: Secondary | ICD-10-CM | POA: Diagnosis not present

## 2016-10-10 DIAGNOSIS — N3941 Urge incontinence: Secondary | ICD-10-CM | POA: Diagnosis not present

## 2016-10-14 DIAGNOSIS — M25569 Pain in unspecified knee: Secondary | ICD-10-CM | POA: Diagnosis not present

## 2016-10-14 DIAGNOSIS — Z170 Estrogen receptor positive status [ER+]: Secondary | ICD-10-CM | POA: Diagnosis not present

## 2016-10-14 DIAGNOSIS — M7989 Other specified soft tissue disorders: Secondary | ICD-10-CM | POA: Diagnosis not present

## 2016-10-14 DIAGNOSIS — C50912 Malignant neoplasm of unspecified site of left female breast: Secondary | ICD-10-CM | POA: Diagnosis not present

## 2016-10-14 DIAGNOSIS — M8580 Other specified disorders of bone density and structure, unspecified site: Secondary | ICD-10-CM | POA: Diagnosis not present

## 2016-10-14 DIAGNOSIS — C50911 Malignant neoplasm of unspecified site of right female breast: Secondary | ICD-10-CM | POA: Diagnosis not present

## 2016-11-04 DIAGNOSIS — M85852 Other specified disorders of bone density and structure, left thigh: Secondary | ICD-10-CM | POA: Diagnosis not present

## 2016-11-04 DIAGNOSIS — E2839 Other primary ovarian failure: Secondary | ICD-10-CM | POA: Diagnosis not present

## 2016-11-04 DIAGNOSIS — M8580 Other specified disorders of bone density and structure, unspecified site: Secondary | ICD-10-CM | POA: Diagnosis not present

## 2016-11-14 DIAGNOSIS — H40013 Open angle with borderline findings, low risk, bilateral: Secondary | ICD-10-CM | POA: Diagnosis not present

## 2016-12-12 DIAGNOSIS — R1032 Left lower quadrant pain: Secondary | ICD-10-CM | POA: Diagnosis not present

## 2016-12-12 DIAGNOSIS — I10 Essential (primary) hypertension: Secondary | ICD-10-CM | POA: Diagnosis not present

## 2016-12-12 DIAGNOSIS — M79671 Pain in right foot: Secondary | ICD-10-CM | POA: Diagnosis not present

## 2016-12-12 DIAGNOSIS — K5792 Diverticulitis of intestine, part unspecified, without perforation or abscess without bleeding: Secondary | ICD-10-CM | POA: Diagnosis not present

## 2016-12-12 DIAGNOSIS — K219 Gastro-esophageal reflux disease without esophagitis: Secondary | ICD-10-CM | POA: Diagnosis not present

## 2017-01-13 DIAGNOSIS — C50919 Malignant neoplasm of unspecified site of unspecified female breast: Secondary | ICD-10-CM | POA: Diagnosis not present

## 2017-01-13 DIAGNOSIS — C50911 Malignant neoplasm of unspecified site of right female breast: Secondary | ICD-10-CM | POA: Diagnosis not present

## 2017-01-13 DIAGNOSIS — Z170 Estrogen receptor positive status [ER+]: Secondary | ICD-10-CM | POA: Diagnosis not present

## 2017-01-13 DIAGNOSIS — M8580 Other specified disorders of bone density and structure, unspecified site: Secondary | ICD-10-CM | POA: Diagnosis not present

## 2017-01-15 DIAGNOSIS — E785 Hyperlipidemia, unspecified: Secondary | ICD-10-CM | POA: Diagnosis not present

## 2017-01-15 DIAGNOSIS — K219 Gastro-esophageal reflux disease without esophagitis: Secondary | ICD-10-CM | POA: Diagnosis not present

## 2017-01-15 DIAGNOSIS — M7989 Other specified soft tissue disorders: Secondary | ICD-10-CM | POA: Diagnosis not present

## 2017-01-15 DIAGNOSIS — I10 Essential (primary) hypertension: Secondary | ICD-10-CM | POA: Diagnosis not present

## 2017-01-15 DIAGNOSIS — G4700 Insomnia, unspecified: Secondary | ICD-10-CM | POA: Diagnosis not present

## 2017-01-15 DIAGNOSIS — F329 Major depressive disorder, single episode, unspecified: Secondary | ICD-10-CM | POA: Diagnosis not present

## 2017-01-15 DIAGNOSIS — E039 Hypothyroidism, unspecified: Secondary | ICD-10-CM | POA: Diagnosis not present

## 2017-01-15 DIAGNOSIS — J4520 Mild intermittent asthma, uncomplicated: Secondary | ICD-10-CM | POA: Diagnosis not present

## 2017-01-20 DIAGNOSIS — N3941 Urge incontinence: Secondary | ICD-10-CM | POA: Diagnosis not present

## 2017-02-06 DIAGNOSIS — M1811 Unilateral primary osteoarthritis of first carpometacarpal joint, right hand: Secondary | ICD-10-CM | POA: Diagnosis not present

## 2017-02-06 DIAGNOSIS — M65342 Trigger finger, left ring finger: Secondary | ICD-10-CM | POA: Diagnosis not present

## 2017-03-12 DIAGNOSIS — Z23 Encounter for immunization: Secondary | ICD-10-CM | POA: Diagnosis not present

## 2017-03-12 DIAGNOSIS — I10 Essential (primary) hypertension: Secondary | ICD-10-CM | POA: Diagnosis not present

## 2017-03-12 DIAGNOSIS — F3341 Major depressive disorder, recurrent, in partial remission: Secondary | ICD-10-CM | POA: Diagnosis not present

## 2017-03-12 DIAGNOSIS — E039 Hypothyroidism, unspecified: Secondary | ICD-10-CM | POA: Diagnosis not present

## 2017-03-12 DIAGNOSIS — E785 Hyperlipidemia, unspecified: Secondary | ICD-10-CM | POA: Diagnosis not present

## 2017-03-12 DIAGNOSIS — J4520 Mild intermittent asthma, uncomplicated: Secondary | ICD-10-CM | POA: Diagnosis not present

## 2017-03-12 DIAGNOSIS — Z79899 Other long term (current) drug therapy: Secondary | ICD-10-CM | POA: Diagnosis not present

## 2017-03-12 DIAGNOSIS — G4700 Insomnia, unspecified: Secondary | ICD-10-CM | POA: Diagnosis not present

## 2017-03-12 DIAGNOSIS — K219 Gastro-esophageal reflux disease without esophagitis: Secondary | ICD-10-CM | POA: Diagnosis not present

## 2017-03-14 DIAGNOSIS — M65341 Trigger finger, right ring finger: Secondary | ICD-10-CM | POA: Diagnosis not present

## 2017-03-14 DIAGNOSIS — M65342 Trigger finger, left ring finger: Secondary | ICD-10-CM | POA: Diagnosis not present

## 2017-03-14 DIAGNOSIS — M1811 Unilateral primary osteoarthritis of first carpometacarpal joint, right hand: Secondary | ICD-10-CM | POA: Diagnosis not present

## 2017-04-07 DIAGNOSIS — M8580 Other specified disorders of bone density and structure, unspecified site: Secondary | ICD-10-CM | POA: Diagnosis not present

## 2017-04-07 DIAGNOSIS — C50911 Malignant neoplasm of unspecified site of right female breast: Secondary | ICD-10-CM | POA: Diagnosis not present

## 2017-04-07 DIAGNOSIS — Z170 Estrogen receptor positive status [ER+]: Secondary | ICD-10-CM | POA: Diagnosis not present

## 2017-04-07 DIAGNOSIS — Z79899 Other long term (current) drug therapy: Secondary | ICD-10-CM | POA: Diagnosis not present

## 2017-04-11 DIAGNOSIS — M65341 Trigger finger, right ring finger: Secondary | ICD-10-CM | POA: Diagnosis not present

## 2017-04-21 DIAGNOSIS — M65341 Trigger finger, right ring finger: Secondary | ICD-10-CM | POA: Diagnosis not present

## 2017-05-15 DIAGNOSIS — M65342 Trigger finger, left ring finger: Secondary | ICD-10-CM | POA: Diagnosis not present

## 2017-05-15 DIAGNOSIS — M65341 Trigger finger, right ring finger: Secondary | ICD-10-CM | POA: Diagnosis not present

## 2017-05-16 DIAGNOSIS — H40013 Open angle with borderline findings, low risk, bilateral: Secondary | ICD-10-CM | POA: Diagnosis not present

## 2017-05-30 DIAGNOSIS — R05 Cough: Secondary | ICD-10-CM | POA: Diagnosis not present

## 2017-05-30 DIAGNOSIS — J00 Acute nasopharyngitis [common cold]: Secondary | ICD-10-CM | POA: Diagnosis not present

## 2017-06-04 DIAGNOSIS — R0602 Shortness of breath: Secondary | ICD-10-CM | POA: Diagnosis not present

## 2017-06-04 DIAGNOSIS — R05 Cough: Secondary | ICD-10-CM | POA: Diagnosis not present

## 2017-06-04 DIAGNOSIS — J019 Acute sinusitis, unspecified: Secondary | ICD-10-CM | POA: Diagnosis not present

## 2017-06-04 DIAGNOSIS — J40 Bronchitis, not specified as acute or chronic: Secondary | ICD-10-CM | POA: Diagnosis not present

## 2017-06-04 DIAGNOSIS — R5381 Other malaise: Secondary | ICD-10-CM | POA: Diagnosis not present

## 2017-06-13 DIAGNOSIS — M6530 Trigger finger, unspecified finger: Secondary | ICD-10-CM | POA: Insufficient documentation

## 2017-06-13 DIAGNOSIS — Z5189 Encounter for other specified aftercare: Secondary | ICD-10-CM | POA: Insufficient documentation

## 2017-07-14 DIAGNOSIS — E039 Hypothyroidism, unspecified: Secondary | ICD-10-CM | POA: Diagnosis not present

## 2017-07-14 DIAGNOSIS — I10 Essential (primary) hypertension: Secondary | ICD-10-CM | POA: Diagnosis not present

## 2017-07-14 DIAGNOSIS — M2550 Pain in unspecified joint: Secondary | ICD-10-CM | POA: Diagnosis not present

## 2017-07-14 DIAGNOSIS — Z79899 Other long term (current) drug therapy: Secondary | ICD-10-CM | POA: Diagnosis not present

## 2017-07-14 DIAGNOSIS — M791 Myalgia, unspecified site: Secondary | ICD-10-CM | POA: Diagnosis not present

## 2017-07-14 DIAGNOSIS — N3946 Mixed incontinence: Secondary | ICD-10-CM | POA: Diagnosis not present

## 2017-07-14 DIAGNOSIS — K219 Gastro-esophageal reflux disease without esophagitis: Secondary | ICD-10-CM | POA: Diagnosis not present

## 2017-07-14 DIAGNOSIS — R062 Wheezing: Secondary | ICD-10-CM | POA: Diagnosis not present

## 2017-07-14 DIAGNOSIS — E785 Hyperlipidemia, unspecified: Secondary | ICD-10-CM | POA: Diagnosis not present

## 2017-08-04 DIAGNOSIS — Z9011 Acquired absence of right breast and nipple: Secondary | ICD-10-CM | POA: Diagnosis not present

## 2017-08-04 DIAGNOSIS — Z79811 Long term (current) use of aromatase inhibitors: Secondary | ICD-10-CM | POA: Diagnosis not present

## 2017-08-04 DIAGNOSIS — Z79899 Other long term (current) drug therapy: Secondary | ICD-10-CM | POA: Diagnosis not present

## 2017-08-04 DIAGNOSIS — Z888 Allergy status to other drugs, medicaments and biological substances status: Secondary | ICD-10-CM | POA: Diagnosis not present

## 2017-08-04 DIAGNOSIS — M8580 Other specified disorders of bone density and structure, unspecified site: Secondary | ICD-10-CM | POA: Diagnosis not present

## 2017-08-04 DIAGNOSIS — Z170 Estrogen receptor positive status [ER+]: Secondary | ICD-10-CM | POA: Diagnosis not present

## 2017-08-04 DIAGNOSIS — C50911 Malignant neoplasm of unspecified site of right female breast: Secondary | ICD-10-CM | POA: Diagnosis not present

## 2017-08-06 DIAGNOSIS — Z79811 Long term (current) use of aromatase inhibitors: Secondary | ICD-10-CM | POA: Insufficient documentation

## 2017-09-01 DIAGNOSIS — M25562 Pain in left knee: Secondary | ICD-10-CM | POA: Diagnosis not present

## 2017-09-01 DIAGNOSIS — M5410 Radiculopathy, site unspecified: Secondary | ICD-10-CM | POA: Diagnosis not present

## 2017-09-12 DIAGNOSIS — M51369 Other intervertebral disc degeneration, lumbar region without mention of lumbar back pain or lower extremity pain: Secondary | ICD-10-CM

## 2017-09-12 DIAGNOSIS — M5136 Other intervertebral disc degeneration, lumbar region: Secondary | ICD-10-CM | POA: Insufficient documentation

## 2017-09-12 HISTORY — DX: Other intervertebral disc degeneration, lumbar region: M51.36

## 2017-09-12 HISTORY — DX: Other intervertebral disc degeneration, lumbar region without mention of lumbar back pain or lower extremity pain: M51.369

## 2017-09-13 DIAGNOSIS — B370 Candidal stomatitis: Secondary | ICD-10-CM | POA: Diagnosis not present

## 2017-09-15 DIAGNOSIS — M5136 Other intervertebral disc degeneration, lumbar region: Secondary | ICD-10-CM | POA: Diagnosis not present

## 2017-09-22 DIAGNOSIS — M545 Low back pain: Secondary | ICD-10-CM | POA: Diagnosis not present

## 2017-09-22 DIAGNOSIS — M5136 Other intervertebral disc degeneration, lumbar region: Secondary | ICD-10-CM | POA: Diagnosis not present

## 2017-10-15 DIAGNOSIS — M7989 Other specified soft tissue disorders: Secondary | ICD-10-CM | POA: Diagnosis not present

## 2017-10-15 DIAGNOSIS — E039 Hypothyroidism, unspecified: Secondary | ICD-10-CM | POA: Diagnosis not present

## 2017-10-15 DIAGNOSIS — L03115 Cellulitis of right lower limb: Secondary | ICD-10-CM | POA: Diagnosis not present

## 2017-10-15 DIAGNOSIS — M5136 Other intervertebral disc degeneration, lumbar region: Secondary | ICD-10-CM | POA: Diagnosis not present

## 2017-10-15 DIAGNOSIS — L03116 Cellulitis of left lower limb: Secondary | ICD-10-CM | POA: Diagnosis not present

## 2017-10-15 DIAGNOSIS — I10 Essential (primary) hypertension: Secondary | ICD-10-CM | POA: Diagnosis not present

## 2017-10-17 DIAGNOSIS — Z170 Estrogen receptor positive status [ER+]: Secondary | ICD-10-CM | POA: Diagnosis not present

## 2017-10-17 DIAGNOSIS — Z9011 Acquired absence of right breast and nipple: Secondary | ICD-10-CM | POA: Diagnosis not present

## 2017-10-17 DIAGNOSIS — C50911 Malignant neoplasm of unspecified site of right female breast: Secondary | ICD-10-CM | POA: Diagnosis not present

## 2017-10-17 DIAGNOSIS — Z79811 Long term (current) use of aromatase inhibitors: Secondary | ICD-10-CM | POA: Diagnosis not present

## 2017-10-17 DIAGNOSIS — C50912 Malignant neoplasm of unspecified site of left female breast: Secondary | ICD-10-CM | POA: Diagnosis not present

## 2017-10-17 DIAGNOSIS — R222 Localized swelling, mass and lump, trunk: Secondary | ICD-10-CM | POA: Diagnosis not present

## 2017-10-23 DIAGNOSIS — M79605 Pain in left leg: Secondary | ICD-10-CM | POA: Diagnosis not present

## 2017-10-23 DIAGNOSIS — J4520 Mild intermittent asthma, uncomplicated: Secondary | ICD-10-CM | POA: Diagnosis not present

## 2017-10-23 DIAGNOSIS — M7989 Other specified soft tissue disorders: Secondary | ICD-10-CM | POA: Diagnosis not present

## 2017-10-23 DIAGNOSIS — M79604 Pain in right leg: Secondary | ICD-10-CM | POA: Diagnosis not present

## 2017-10-23 DIAGNOSIS — E039 Hypothyroidism, unspecified: Secondary | ICD-10-CM | POA: Diagnosis not present

## 2017-10-23 DIAGNOSIS — E785 Hyperlipidemia, unspecified: Secondary | ICD-10-CM | POA: Diagnosis not present

## 2017-10-23 DIAGNOSIS — I10 Essential (primary) hypertension: Secondary | ICD-10-CM | POA: Diagnosis not present

## 2017-10-23 DIAGNOSIS — L03119 Cellulitis of unspecified part of limb: Secondary | ICD-10-CM | POA: Diagnosis not present

## 2017-10-23 DIAGNOSIS — K219 Gastro-esophageal reflux disease without esophagitis: Secondary | ICD-10-CM | POA: Diagnosis not present

## 2017-10-23 DIAGNOSIS — R490 Dysphonia: Secondary | ICD-10-CM | POA: Diagnosis not present

## 2017-11-05 DIAGNOSIS — M79605 Pain in left leg: Secondary | ICD-10-CM | POA: Diagnosis not present

## 2017-11-05 DIAGNOSIS — M79604 Pain in right leg: Secondary | ICD-10-CM | POA: Diagnosis not present

## 2017-11-14 DIAGNOSIS — H40013 Open angle with borderline findings, low risk, bilateral: Secondary | ICD-10-CM | POA: Diagnosis not present

## 2017-11-19 DIAGNOSIS — L03116 Cellulitis of left lower limb: Secondary | ICD-10-CM | POA: Diagnosis not present

## 2017-11-19 DIAGNOSIS — L03115 Cellulitis of right lower limb: Secondary | ICD-10-CM | POA: Diagnosis not present

## 2017-12-02 DIAGNOSIS — L810 Postinflammatory hyperpigmentation: Secondary | ICD-10-CM | POA: Diagnosis not present

## 2017-12-02 DIAGNOSIS — I8310 Varicose veins of unspecified lower extremity with inflammation: Secondary | ICD-10-CM | POA: Diagnosis not present

## 2017-12-17 DIAGNOSIS — M79642 Pain in left hand: Secondary | ICD-10-CM | POA: Diagnosis not present

## 2018-01-14 DIAGNOSIS — J385 Laryngeal spasm: Secondary | ICD-10-CM

## 2018-01-14 DIAGNOSIS — J383 Other diseases of vocal cords: Secondary | ICD-10-CM

## 2018-01-14 DIAGNOSIS — R490 Dysphonia: Secondary | ICD-10-CM | POA: Diagnosis not present

## 2018-01-14 DIAGNOSIS — R682 Dry mouth, unspecified: Secondary | ICD-10-CM | POA: Diagnosis not present

## 2018-01-14 HISTORY — DX: Laryngeal spasm: J38.5

## 2018-01-14 HISTORY — DX: Other diseases of vocal cords: J38.3

## 2018-01-23 DIAGNOSIS — E785 Hyperlipidemia, unspecified: Secondary | ICD-10-CM | POA: Diagnosis not present

## 2018-01-23 DIAGNOSIS — J4520 Mild intermittent asthma, uncomplicated: Secondary | ICD-10-CM | POA: Diagnosis not present

## 2018-01-23 DIAGNOSIS — Z6841 Body Mass Index (BMI) 40.0 and over, adult: Secondary | ICD-10-CM | POA: Diagnosis not present

## 2018-01-23 DIAGNOSIS — I872 Venous insufficiency (chronic) (peripheral): Secondary | ICD-10-CM | POA: Diagnosis not present

## 2018-01-23 DIAGNOSIS — Z79899 Other long term (current) drug therapy: Secondary | ICD-10-CM | POA: Diagnosis not present

## 2018-01-23 DIAGNOSIS — I10 Essential (primary) hypertension: Secondary | ICD-10-CM | POA: Diagnosis not present

## 2018-01-23 DIAGNOSIS — E039 Hypothyroidism, unspecified: Secondary | ICD-10-CM | POA: Diagnosis not present

## 2018-01-23 DIAGNOSIS — M159 Polyosteoarthritis, unspecified: Secondary | ICD-10-CM | POA: Diagnosis not present

## 2018-01-23 DIAGNOSIS — K219 Gastro-esophageal reflux disease without esophagitis: Secondary | ICD-10-CM | POA: Diagnosis not present

## 2018-01-27 DIAGNOSIS — Z79899 Other long term (current) drug therapy: Secondary | ICD-10-CM | POA: Diagnosis not present

## 2018-01-27 DIAGNOSIS — E785 Hyperlipidemia, unspecified: Secondary | ICD-10-CM | POA: Diagnosis not present

## 2018-01-27 DIAGNOSIS — I10 Essential (primary) hypertension: Secondary | ICD-10-CM | POA: Diagnosis not present

## 2018-01-27 DIAGNOSIS — E039 Hypothyroidism, unspecified: Secondary | ICD-10-CM | POA: Diagnosis not present

## 2018-02-12 DIAGNOSIS — E041 Nontoxic single thyroid nodule: Secondary | ICD-10-CM | POA: Diagnosis not present

## 2018-02-12 DIAGNOSIS — Z9011 Acquired absence of right breast and nipple: Secondary | ICD-10-CM | POA: Diagnosis not present

## 2018-02-12 DIAGNOSIS — E063 Autoimmune thyroiditis: Secondary | ICD-10-CM | POA: Diagnosis not present

## 2018-02-12 DIAGNOSIS — Z923 Personal history of irradiation: Secondary | ICD-10-CM | POA: Diagnosis not present

## 2018-02-12 DIAGNOSIS — Z853 Personal history of malignant neoplasm of breast: Secondary | ICD-10-CM | POA: Diagnosis not present

## 2018-02-26 DIAGNOSIS — J383 Other diseases of vocal cords: Secondary | ICD-10-CM | POA: Diagnosis not present

## 2018-02-26 DIAGNOSIS — R490 Dysphonia: Secondary | ICD-10-CM | POA: Diagnosis not present

## 2018-02-26 DIAGNOSIS — J385 Laryngeal spasm: Secondary | ICD-10-CM | POA: Diagnosis not present

## 2018-03-03 DIAGNOSIS — M65342 Trigger finger, left ring finger: Secondary | ICD-10-CM | POA: Diagnosis not present

## 2018-03-03 HISTORY — DX: Trigger finger, left ring finger: M65.342

## 2018-03-05 DIAGNOSIS — J385 Laryngeal spasm: Secondary | ICD-10-CM | POA: Diagnosis not present

## 2018-03-05 DIAGNOSIS — J383 Other diseases of vocal cords: Secondary | ICD-10-CM | POA: Diagnosis not present

## 2018-03-05 DIAGNOSIS — R490 Dysphonia: Secondary | ICD-10-CM | POA: Diagnosis not present

## 2018-03-09 DIAGNOSIS — J385 Laryngeal spasm: Secondary | ICD-10-CM | POA: Diagnosis not present

## 2018-03-09 DIAGNOSIS — J383 Other diseases of vocal cords: Secondary | ICD-10-CM | POA: Diagnosis not present

## 2018-03-09 DIAGNOSIS — Z23 Encounter for immunization: Secondary | ICD-10-CM | POA: Diagnosis not present

## 2018-03-09 DIAGNOSIS — Z79811 Long term (current) use of aromatase inhibitors: Secondary | ICD-10-CM | POA: Diagnosis not present

## 2018-03-09 DIAGNOSIS — M8588 Other specified disorders of bone density and structure, other site: Secondary | ICD-10-CM | POA: Diagnosis not present

## 2018-03-09 DIAGNOSIS — Z170 Estrogen receptor positive status [ER+]: Secondary | ICD-10-CM | POA: Diagnosis not present

## 2018-03-09 DIAGNOSIS — C50911 Malignant neoplasm of unspecified site of right female breast: Secondary | ICD-10-CM | POA: Diagnosis not present

## 2018-03-09 DIAGNOSIS — R490 Dysphonia: Secondary | ICD-10-CM | POA: Diagnosis not present

## 2018-03-16 ENCOUNTER — Other Ambulatory Visit: Payer: Self-pay

## 2018-03-16 DIAGNOSIS — I878 Other specified disorders of veins: Secondary | ICD-10-CM

## 2018-03-17 DIAGNOSIS — E876 Hypokalemia: Secondary | ICD-10-CM | POA: Diagnosis not present

## 2018-03-17 DIAGNOSIS — M65342 Trigger finger, left ring finger: Secondary | ICD-10-CM | POA: Diagnosis not present

## 2018-03-26 DIAGNOSIS — E039 Hypothyroidism, unspecified: Secondary | ICD-10-CM | POA: Diagnosis not present

## 2018-04-21 ENCOUNTER — Other Ambulatory Visit: Payer: Self-pay

## 2018-04-27 DIAGNOSIS — Z0000 Encounter for general adult medical examination without abnormal findings: Secondary | ICD-10-CM | POA: Diagnosis not present

## 2018-04-27 DIAGNOSIS — Z1339 Encounter for screening examination for other mental health and behavioral disorders: Secondary | ICD-10-CM | POA: Diagnosis not present

## 2018-04-27 DIAGNOSIS — Z23 Encounter for immunization: Secondary | ICD-10-CM | POA: Diagnosis not present

## 2018-04-27 DIAGNOSIS — M150 Primary generalized (osteo)arthritis: Secondary | ICD-10-CM | POA: Diagnosis not present

## 2018-04-27 DIAGNOSIS — Z1331 Encounter for screening for depression: Secondary | ICD-10-CM | POA: Diagnosis not present

## 2018-04-27 DIAGNOSIS — Z6841 Body Mass Index (BMI) 40.0 and over, adult: Secondary | ICD-10-CM | POA: Diagnosis not present

## 2018-04-28 DIAGNOSIS — M79642 Pain in left hand: Secondary | ICD-10-CM | POA: Diagnosis not present

## 2018-04-28 DIAGNOSIS — M62522 Muscle wasting and atrophy, not elsewhere classified, left upper arm: Secondary | ICD-10-CM | POA: Diagnosis not present

## 2018-04-28 DIAGNOSIS — S6992XS Unspecified injury of left wrist, hand and finger(s), sequela: Secondary | ICD-10-CM | POA: Diagnosis not present

## 2018-05-01 DIAGNOSIS — M79642 Pain in left hand: Secondary | ICD-10-CM | POA: Diagnosis not present

## 2018-05-01 DIAGNOSIS — M62522 Muscle wasting and atrophy, not elsewhere classified, left upper arm: Secondary | ICD-10-CM | POA: Diagnosis not present

## 2018-05-01 DIAGNOSIS — S6992XS Unspecified injury of left wrist, hand and finger(s), sequela: Secondary | ICD-10-CM | POA: Diagnosis not present

## 2018-05-05 DIAGNOSIS — M79642 Pain in left hand: Secondary | ICD-10-CM | POA: Diagnosis not present

## 2018-05-05 DIAGNOSIS — M62522 Muscle wasting and atrophy, not elsewhere classified, left upper arm: Secondary | ICD-10-CM | POA: Diagnosis not present

## 2018-05-05 DIAGNOSIS — S6992XS Unspecified injury of left wrist, hand and finger(s), sequela: Secondary | ICD-10-CM | POA: Diagnosis not present

## 2018-05-07 DIAGNOSIS — M79642 Pain in left hand: Secondary | ICD-10-CM | POA: Diagnosis not present

## 2018-05-07 DIAGNOSIS — M62522 Muscle wasting and atrophy, not elsewhere classified, left upper arm: Secondary | ICD-10-CM | POA: Diagnosis not present

## 2018-05-07 DIAGNOSIS — S6992XS Unspecified injury of left wrist, hand and finger(s), sequela: Secondary | ICD-10-CM | POA: Diagnosis not present

## 2018-05-11 DIAGNOSIS — S6992XS Unspecified injury of left wrist, hand and finger(s), sequela: Secondary | ICD-10-CM | POA: Diagnosis not present

## 2018-05-11 DIAGNOSIS — M62522 Muscle wasting and atrophy, not elsewhere classified, left upper arm: Secondary | ICD-10-CM | POA: Diagnosis not present

## 2018-05-11 DIAGNOSIS — M79642 Pain in left hand: Secondary | ICD-10-CM | POA: Diagnosis not present

## 2018-05-13 DIAGNOSIS — M79642 Pain in left hand: Secondary | ICD-10-CM | POA: Diagnosis not present

## 2018-05-13 DIAGNOSIS — S6992XS Unspecified injury of left wrist, hand and finger(s), sequela: Secondary | ICD-10-CM | POA: Diagnosis not present

## 2018-05-13 DIAGNOSIS — M62522 Muscle wasting and atrophy, not elsewhere classified, left upper arm: Secondary | ICD-10-CM | POA: Diagnosis not present

## 2018-05-19 DIAGNOSIS — H40013 Open angle with borderline findings, low risk, bilateral: Secondary | ICD-10-CM | POA: Diagnosis not present

## 2018-05-19 DIAGNOSIS — H524 Presbyopia: Secondary | ICD-10-CM | POA: Diagnosis not present

## 2018-05-22 ENCOUNTER — Other Ambulatory Visit: Payer: Self-pay

## 2018-05-22 ENCOUNTER — Ambulatory Visit (INDEPENDENT_AMBULATORY_CARE_PROVIDER_SITE_OTHER): Payer: Medicare Other | Admitting: Vascular Surgery

## 2018-05-22 ENCOUNTER — Encounter: Payer: Self-pay | Admitting: Vascular Surgery

## 2018-05-22 ENCOUNTER — Ambulatory Visit (HOSPITAL_COMMUNITY)
Admission: RE | Admit: 2018-05-22 | Discharge: 2018-05-22 | Disposition: A | Discharge status: Home / Self Care | Payer: Medicare Other | Source: Ambulatory Visit | Attending: Family | Admitting: Family

## 2018-05-22 VITALS — BP 161/75 | HR 67 | Temp 97.5°F | Resp 20 | Ht 66.0 in | Wt 277.0 lb

## 2018-05-22 DIAGNOSIS — I878 Other specified disorders of veins: Secondary | ICD-10-CM

## 2018-05-22 DIAGNOSIS — I872 Venous insufficiency (chronic) (peripheral): Secondary | ICD-10-CM

## 2018-05-22 NOTE — Progress Notes (Signed)
Patient ID: Katelyn Watson, female   DOB: 1945-07-18, 72 y.o.   MRN: [id number]  Reason for Consult: New Patient (Initial Visit) (new varicose vein)   Referred by Ernestene Kiel, MD  Subjective:     HPI:  Katelyn Watson is a 72 y.o. female with a history of right greater saphenous vein ablation in 2012.  She has never had any left-sided venous interventions.  She previously wore compression stockings has not worn them lately as she is concerned with her thin skin she would cause ulceration.  She has never had ulceration.  Her mother did have ulceration of her lower extremities.  She is never had a DVT.  She does not take blood thinners at this time.  She continues to walk without limitation.  Venous reflux studies performed prior to today's visit.  Past Medical History:  Diagnosis Date  . Anemia    hx of   . Arthritis   . Asthma    ??  . Breast cancer ([place])    treated with masectomy, chemo, and xrt  . Depression   . Family history of anesthesia complication    brother- became stiff after sinus surgery   . GERD (gastroesophageal reflux disease)   . HLD (hyperlipidemia)   . HTN (hypertension)   . Hypothyroidism   . Shortness of breath    slight with exertion    Family History  Problem Relation Age of Onset  . Heart disease Other        father and brother (afib); mother (MI)  . Cancer Other        mother (colon); brother (melanoma, lung)   Past Surgical History:  Procedure Laterality Date  . ABDOMINAL HYSTERECTOMY    . breast masectomy  1999  . CARDIAC CATHETERIZATION    . DILATION AND CURETTAGE OF UTERUS  2002  . TONSILLECTOMY     and adenoidectomy   . TOTAL KNEE ARTHROPLASTY Left 01/17/2014   Procedure: LEFT TOTAL KNEE ARTHROPLASTY;  Surgeon: Mauri Pole, MD;  Location: [facility];  Service: Orthopedics;  Laterality: Left;  Marland Kitchen VARICOSE VEIN SURGERY    . VESICOVAGINAL FISTULA CLOSURE W/ TAH  2004    Short Social History:  Social History   Tobacco Use  . Smoking  status: Never Smoker  . Smokeless tobacco: Never Used  Substance Use Topics  . Alcohol use: No    Allergies  Allergen Reactions  . Singulair [Montelukast Sodium]     Causes weakness   . Latex Rash  . Tape Rash    Current Outpatient Medications  Medication Sig Dispense Refill  . albuterol (PROVENTIL HFA) 108 (90 BASE) MCG/ACT inhaler Inhale 1-2 puffs into the lungs every 6 (six) hours as needed for wheezing or shortness of breath.     Marland Kitchen albuterol (PROVENTIL) (2.5 MG/3ML) 0.083% nebulizer solution Take 2.5 mg by nebulization every 6 (six) hours as needed for wheezing or shortness of breath.    Marland Kitchen ammonium lactate (LAC-HYDRIN) 12 % lotion Apply topically.    Marland Kitchen atorvastatin (LIPITOR) 10 MG tablet Take 10 mg by mouth at bedtime.     . Calcium Carbonate-Vit D-Min (CALTRATE 600+D PLUS) 600-400 MG-UNIT per tablet Take 2 tablets by mouth daily.      . carvedilol (COREG) 12.5 MG tablet Take 12.5 mg by mouth 2 (two) times daily with a meal.     . fexofenadine (ALLEGRA) 180 MG tablet Take 180 mg by mouth daily.      Marland Kitchen glucosamine-chondroitin 500-400  MG tablet Take 1 tablet by mouth 2 (two) times daily.    . hydrochlorothiazide (HYDRODIURIL) 25 MG tablet Take 25 mg by mouth every morning.    Marland Kitchen levothyroxine (SYNTHROID, LEVOTHROID) 100 MCG tablet Take 100 mcg by mouth daily before breakfast.     . losartan (COZAAR) 100 MG tablet Take 100 mg by mouth at bedtime.     . Multiple Vitamins-Minerals (CENTRUM SILVER PO) Take 1 tablet by mouth daily.      Marland Kitchen omeprazole (PRILOSEC) 20 MG capsule Take 20 mg by mouth 2 (two) times daily.      . polyethylene glycol (MIRALAX / GLYCOLAX) packet Take 17 g by mouth 2 (two) times daily. 14 each 0  . potassium chloride (K-DUR,KLOR-CON) 10 MEQ tablet Take 10 mEq by mouth 2 (two) times daily.    . sertraline (ZOLOFT) 50 MG tablet Take 100 mg by mouth every morning.     . torsemide (DEMADEX) 10 MG tablet Take 10 mg by mouth daily.    . traMADol (ULTRAM) 5 mg/mL SUSP  tramadol    . traZODone (DESYREL) 150 MG tablet Take 150 mg by mouth at bedtime.     . docusate sodium 100 MG CAPS Take 100 mg by mouth 2 (two) times daily. (Patient not taking: Reported on 05/22/2018) 10 capsule 0  . ferrous sulfate 325 (65 FE) MG tablet Take 1 tablet (325 mg total) by mouth 3 (three) times daily after meals. (Patient not taking: Reported on 05/22/2018)  3  . HYDROcodone-acetaminophen (NORCO) 7.5-325 MG per tablet Take 1-2 tablets by mouth every 4 (four) hours as needed for moderate pain. (Patient not taking: Reported on 05/22/2018) 100 tablet 0  . tiZANidine (ZANAFLEX) 4 MG tablet Take 1 tablet (4 mg total) by mouth every 6 (six) hours as needed for muscle spasms. (Patient not taking: Reported on 05/22/2018) 30 tablet 0   No current facility-administered medications for this visit.     Review of Systems  Constitutional:  Constitutional negative. HENT: HENT negative.  Eyes: Eyes negative.  Respiratory: Positive for wheezing.  Cardiovascular: Cardiovascular negative.  Musculoskeletal: Musculoskeletal negative.  Skin: Positive for rash. Negative for wound.  Neurological: Neurological negative. Hematologic: Hematologic/lymphatic negative.  Psychiatric: Psychiatric negative.        Objective:  Objective   Vitals:   05/22/18 1155  BP: (!) 161/75  Pulse: 67  Resp: 20  Temp: (!) 97.5 F (36.4 C)  SpO2: 98%  Weight: 277 lb (125.6 kg)  Height: 5\' 6"  (1.676 m)   Body mass index is 44.71 kg/m.  Physical Exam HENT:     Mouth/Throat:     Mouth: Mucous membranes are moist.  Neck:     Musculoskeletal: Normal range of motion and neck supple.  Cardiovascular:     Rate and Rhythm: Normal rate.     Pulses:          Radial pulses are 2+ on the right side and 2+ on the left side.       Popliteal pulses are 2+ on the right side and 2+ on the left side.  Abdominal:     General: Abdomen is flat.  Musculoskeletal:        General: No swelling.  Skin:    Comments:  Bilateral ankles demonstrate atrophie blanche  Neurological:     General: No focal deficit present.     Mental Status: She is alert and oriented to person, place, and time.  Psychiatric:        Mood  and Affect: Mood normal.        Thought Content: Thought content normal.        Judgment: Judgment normal.     Data: I have independently interpreted her lower extremity venous reflux study which demonstrates reflux in the right greater saphenous vein in the calf 2560 ms in the left greater saphenous vein in the thigh 1394 ms.  Left greater saphenous vein measures up to 0.62 cm at the junction and the right greater saphenous vein in the proximal mid calf measures 0.54 cm and 0.34 cm small saphenous vein is less than 0.3 cm bilaterally     Assessment/Plan:    72 year old female with history of bilateral C4 venous disease having had ablation of her right greater saphenous vein but she does have some greater saphenous vein that is refluxing in the calf although the small saphenous vein does not appear to be contributory.  She does have appear to have very thin skin that is at risk for ulceration.  I have recommended thigh-high compression stockings and follow-up in 3 months for evaluation of ablation.  I do think we could ablate the left greater saphenous vein but the greater saphenous vein in the right calf is probably too superficial for laser.  I discussed this with her the ultimate decision will be made in 3 months when she follows up in the office.  She demonstrates very good understanding having been through this before.      Waynetta Sandy MD [facility]

## 2018-06-02 DIAGNOSIS — E039 Hypothyroidism, unspecified: Secondary | ICD-10-CM | POA: Diagnosis not present

## 2018-06-25 DIAGNOSIS — M65342 Trigger finger, left ring finger: Secondary | ICD-10-CM | POA: Diagnosis not present

## 2018-07-06 DIAGNOSIS — Z9011 Acquired absence of right breast and nipple: Secondary | ICD-10-CM | POA: Diagnosis not present

## 2018-07-06 DIAGNOSIS — Z170 Estrogen receptor positive status [ER+]: Secondary | ICD-10-CM | POA: Diagnosis not present

## 2018-07-06 DIAGNOSIS — Z79899 Other long term (current) drug therapy: Secondary | ICD-10-CM | POA: Diagnosis not present

## 2018-07-06 DIAGNOSIS — C50911 Malignant neoplasm of unspecified site of right female breast: Secondary | ICD-10-CM | POA: Diagnosis not present

## 2018-07-06 DIAGNOSIS — M8580 Other specified disorders of bone density and structure, unspecified site: Secondary | ICD-10-CM | POA: Diagnosis not present

## 2018-08-06 ENCOUNTER — Ambulatory Visit: Payer: Medicare Other | Admitting: Vascular Surgery

## 2018-08-25 DIAGNOSIS — E785 Hyperlipidemia, unspecified: Secondary | ICD-10-CM | POA: Diagnosis not present

## 2018-08-25 DIAGNOSIS — E039 Hypothyroidism, unspecified: Secondary | ICD-10-CM | POA: Diagnosis not present

## 2018-08-25 DIAGNOSIS — R5383 Other fatigue: Secondary | ICD-10-CM | POA: Diagnosis not present

## 2018-08-25 DIAGNOSIS — M2550 Pain in unspecified joint: Secondary | ICD-10-CM | POA: Diagnosis not present

## 2018-08-25 DIAGNOSIS — Z79899 Other long term (current) drug therapy: Secondary | ICD-10-CM | POA: Diagnosis not present

## 2018-08-25 DIAGNOSIS — I10 Essential (primary) hypertension: Secondary | ICD-10-CM | POA: Diagnosis not present

## 2018-08-25 DIAGNOSIS — M791 Myalgia, unspecified site: Secondary | ICD-10-CM | POA: Diagnosis not present

## 2018-09-09 DIAGNOSIS — Z6841 Body Mass Index (BMI) 40.0 and over, adult: Secondary | ICD-10-CM | POA: Diagnosis not present

## 2018-09-09 DIAGNOSIS — M150 Primary generalized (osteo)arthritis: Secondary | ICD-10-CM | POA: Diagnosis not present

## 2018-09-09 DIAGNOSIS — R768 Other specified abnormal immunological findings in serum: Secondary | ICD-10-CM | POA: Diagnosis not present

## 2018-09-09 DIAGNOSIS — R5383 Other fatigue: Secondary | ICD-10-CM | POA: Diagnosis not present

## 2018-09-09 DIAGNOSIS — M2550 Pain in unspecified joint: Secondary | ICD-10-CM | POA: Diagnosis not present

## 2018-09-09 DIAGNOSIS — M6530 Trigger finger, unspecified finger: Secondary | ICD-10-CM | POA: Diagnosis not present

## 2018-09-15 DIAGNOSIS — M65342 Trigger finger, left ring finger: Secondary | ICD-10-CM | POA: Diagnosis not present

## 2018-10-22 DIAGNOSIS — M150 Primary generalized (osteo)arthritis: Secondary | ICD-10-CM | POA: Diagnosis not present

## 2018-10-22 DIAGNOSIS — R768 Other specified abnormal immunological findings in serum: Secondary | ICD-10-CM | POA: Diagnosis not present

## 2018-10-22 DIAGNOSIS — M6530 Trigger finger, unspecified finger: Secondary | ICD-10-CM | POA: Diagnosis not present

## 2018-10-22 DIAGNOSIS — M2550 Pain in unspecified joint: Secondary | ICD-10-CM | POA: Diagnosis not present

## 2018-11-09 DIAGNOSIS — C50911 Malignant neoplasm of unspecified site of right female breast: Secondary | ICD-10-CM | POA: Diagnosis not present

## 2018-11-09 DIAGNOSIS — Z170 Estrogen receptor positive status [ER+]: Secondary | ICD-10-CM | POA: Diagnosis not present

## 2018-12-14 DIAGNOSIS — Z170 Estrogen receptor positive status [ER+]: Secondary | ICD-10-CM | POA: Diagnosis not present

## 2018-12-14 DIAGNOSIS — C50911 Malignant neoplasm of unspecified site of right female breast: Secondary | ICD-10-CM | POA: Diagnosis not present

## 2018-12-22 DIAGNOSIS — E039 Hypothyroidism, unspecified: Secondary | ICD-10-CM | POA: Diagnosis not present

## 2018-12-22 DIAGNOSIS — E785 Hyperlipidemia, unspecified: Secondary | ICD-10-CM | POA: Diagnosis not present

## 2018-12-22 DIAGNOSIS — I10 Essential (primary) hypertension: Secondary | ICD-10-CM | POA: Diagnosis not present

## 2018-12-22 DIAGNOSIS — K219 Gastro-esophageal reflux disease without esophagitis: Secondary | ICD-10-CM | POA: Diagnosis not present

## 2018-12-22 DIAGNOSIS — Z79899 Other long term (current) drug therapy: Secondary | ICD-10-CM | POA: Diagnosis not present

## 2018-12-22 DIAGNOSIS — M8949 Other hypertrophic osteoarthropathy, multiple sites: Secondary | ICD-10-CM | POA: Diagnosis not present

## 2018-12-22 DIAGNOSIS — Z6841 Body Mass Index (BMI) 40.0 and over, adult: Secondary | ICD-10-CM | POA: Diagnosis not present

## 2019-02-05 DIAGNOSIS — R768 Other specified abnormal immunological findings in serum: Secondary | ICD-10-CM | POA: Diagnosis not present

## 2019-02-05 DIAGNOSIS — M2550 Pain in unspecified joint: Secondary | ICD-10-CM | POA: Diagnosis not present

## 2019-02-05 DIAGNOSIS — Z79899 Other long term (current) drug therapy: Secondary | ICD-10-CM | POA: Diagnosis not present

## 2019-02-05 DIAGNOSIS — M150 Primary generalized (osteo)arthritis: Secondary | ICD-10-CM | POA: Diagnosis not present

## 2019-03-25 ENCOUNTER — Encounter: Payer: Self-pay | Admitting: Sports Medicine

## 2019-03-25 ENCOUNTER — Other Ambulatory Visit: Payer: Self-pay

## 2019-03-25 ENCOUNTER — Ambulatory Visit (INDEPENDENT_AMBULATORY_CARE_PROVIDER_SITE_OTHER): Payer: Medicare Other | Admitting: Sports Medicine

## 2019-03-25 DIAGNOSIS — L603 Nail dystrophy: Secondary | ICD-10-CM

## 2019-03-25 DIAGNOSIS — I739 Peripheral vascular disease, unspecified: Secondary | ICD-10-CM

## 2019-03-25 DIAGNOSIS — Z23 Encounter for immunization: Secondary | ICD-10-CM | POA: Diagnosis not present

## 2019-03-25 DIAGNOSIS — M79675 Pain in left toe(s): Secondary | ICD-10-CM | POA: Diagnosis not present

## 2019-03-25 DIAGNOSIS — M79674 Pain in right toe(s): Secondary | ICD-10-CM

## 2019-03-25 NOTE — Progress Notes (Signed)
Subjective: Katelyn Watson is a 73 y.o. female patient seen today in office with complaint of mildly painful thickened and elongated toenails right greater than left first toes with abnormal growth; unable to trim. Patient denies history of Diabetes, Neuropathy, but does admit to a history of circulation problems.  Patient has no other pedal complaints at this time.   Review of Systems  All other systems reviewed and are negative.    Patient Active Problem List   Diagnosis Date Noted  . Acquired trigger finger of left ring finger 03/03/2018  . Dry mouth 01/14/2018  . Laryngospasms 01/14/2018  . 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 Degeneration of lumbar intervertebral disc 09/12/2017  . Aromatase inhibitor use 08/06/2017  . Aftercare 06/13/2017  . Trigger finger of left hand 06/13/2017  . H/O Hashimoto thyroiditis 09/12/2016  . Asthma 08/02/2016  . Hypothyroidism 08/02/2016  . Morbid obesity ([place]) 01/19/2014  . Postoperative anemia due to acute blood loss 01/19/2014  . 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  . Breast cancer, stage 3 ([place]) 05/08/2011  . DIASTOLIC DYSFUNCTION [id number]  . HYPERLIPIDEMIA 11/28/2008  . HYPERTENSION 11/28/2008  . ASTHMA 11/28/2008  . DYSPNEA ON EXERTION 11/28/2008  . COUGH 11/28/2008    Current Outpatient Medications on File Prior to Visit  Medication Sig Dispense Refill  . letrozole (FEMARA) 2.5 MG tablet Take by mouth.    Marland Kitchen albuterol (PROVENTIL HFA) 108 (90 BASE) MCG/ACT inhaler Inhale 1-2 puffs into the lungs every 6 (six) hours as needed for wheezing or shortness of breath.     Marland Kitchen albuterol (PROVENTIL) (2.5 MG/3ML) 0.083% nebulizer solution Take 2.5 mg by nebulization every 6 (six) hours as needed for wheezing or shortness of breath.    Marland Kitchen ammonium lactate (LAC-HYDRIN) 12 % lotion Apply topically.    Marland Kitchen atorvastatin (LIPITOR) 10 MG tablet Take 10 mg by mouth at bedtime.     . Calcium Carbonate-Vit D-Min (CALTRATE 600+D PLUS)  600-400 MG-UNIT per tablet Take 2 tablets by mouth daily.      . carvedilol (COREG) 12.5 MG tablet Take 12.5 mg by mouth 2 (two) times daily with a meal.     . diclofenac (VOLTAREN) 75 MG EC tablet Take by mouth.    . fexofenadine (ALLEGRA) 180 MG tablet Take 180 mg by mouth daily.      . hydrochlorothiazide (HYDRODIURIL) 25 MG tablet Take 25 mg by mouth every morning.    Marland Kitchen levothyroxine (SYNTHROID, LEVOTHROID) 100 MCG tablet Take 100 mcg by mouth daily before breakfast.     . losartan (COZAAR) 100 MG tablet Take 100 mg by mouth at bedtime.     . Multiple Vitamins-Minerals (CENTRUM SILVER PO) Take 1 tablet by mouth daily.      Marland Kitchen omeprazole (PRILOSEC) 20 MG capsule Take 20 mg by mouth 2 (two) times daily.      . potassium chloride (K-DUR,KLOR-CON) 10 MEQ tablet Take 10 mEq by mouth 2 (two) times daily.    . sertraline (ZOLOFT) 50 MG tablet Take 100 mg by mouth every morning.     . torsemide (DEMADEX) 10 MG tablet Take 10 mg by mouth daily.    . traZODone (DESYREL) 150 MG tablet Take 150 mg by mouth at bedtime.      No current facility-administered medications on file prior to visit.     Allergies  Allergen Reactions  . Singulair [Montelukast Sodium]     Causes weakness   . Latex Rash  . Tape Rash  Objective: Physical Exam  General: Well developed, nourished, no acute distress, awake, alert and oriented x 3  Vascular: Dorsalis pedis artery 2/4 bilateral, Posterior tibial artery 0/4 bilateral due to trace edema ankles, skin temperature warm to cool proximal to distal bilateral lower extremities, moderate varicosities, no pedal hair present bilateral.  Venous hyperpigmentation noted bilateral.  Neurological: Gross sensation present via light touch bilateral.   Dermatological: Skin is warm, dry, and supple bilateral, Nails 1-10 are tender, long, thick, and discolored with mild subungal debris with bilateral hallux nails most involved and the right hallux nail has multiple trauma lines  and growth that appears to be in a rams horn pattern, no webspace macerations present bilateral, no open lesions present bilateral, no callus/corns/hyperkeratotic tissue present bilateral. No signs of infection bilateral.  Musculoskeletal: Asymptomatic hammertoe boney deformities noted bilateral. Muscular strength within normal limits without painon range of motion. No pain with calf compression bilateral.  Assessment and Plan:  Problem List Items Addressed This Visit    None    Visit Diagnoses    Nail dystrophy    -  Primary   Toe pain, bilateral       PVD (peripheral vascular disease) ([place])         -Examined patient.  -Discussed treatment options for painful mycotic nails especially at the right first toenail -ABN signed -Mechanically debrided and reduced mycotic nails with sterile nail nipper and dremel nail file without incident. -Advised patient to use Vicks VapoRub to the thick toenails -Advised patient if her right great toenail continues to grow out with trauma lines and very thick may benefit from a nail removal however due to her vascular problems and history of PVD do not recommend this as the first approach -Patient to return in 2 months for follow up evaluation or sooner if symptoms worsen.  Landis Martins, DPM

## 2019-04-26 DIAGNOSIS — Z9012 Acquired absence of left breast and nipple: Secondary | ICD-10-CM | POA: Diagnosis not present

## 2019-04-26 DIAGNOSIS — M1909 Primary osteoarthritis, other specified site: Secondary | ICD-10-CM | POA: Diagnosis not present

## 2019-04-26 DIAGNOSIS — C50911 Malignant neoplasm of unspecified site of right female breast: Secondary | ICD-10-CM | POA: Diagnosis not present

## 2019-04-26 DIAGNOSIS — C50411 Malignant neoplasm of upper-outer quadrant of right female breast: Secondary | ICD-10-CM | POA: Diagnosis not present

## 2019-04-26 DIAGNOSIS — Z79811 Long term (current) use of aromatase inhibitors: Secondary | ICD-10-CM | POA: Diagnosis not present

## 2019-04-26 DIAGNOSIS — Z170 Estrogen receptor positive status [ER+]: Secondary | ICD-10-CM | POA: Diagnosis not present

## 2019-04-26 DIAGNOSIS — Z923 Personal history of irradiation: Secondary | ICD-10-CM | POA: Diagnosis not present

## 2019-04-26 DIAGNOSIS — C779 Secondary and unspecified malignant neoplasm of lymph node, unspecified: Secondary | ICD-10-CM | POA: Diagnosis not present

## 2019-04-26 DIAGNOSIS — M8580 Other specified disorders of bone density and structure, unspecified site: Secondary | ICD-10-CM | POA: Diagnosis not present

## 2019-04-26 DIAGNOSIS — Z9221 Personal history of antineoplastic chemotherapy: Secondary | ICD-10-CM | POA: Diagnosis not present

## 2019-04-28 ENCOUNTER — Other Ambulatory Visit: Payer: Self-pay

## 2019-05-11 DIAGNOSIS — E7800 Pure hypercholesterolemia, unspecified: Secondary | ICD-10-CM | POA: Diagnosis not present

## 2019-05-11 DIAGNOSIS — M6530 Trigger finger, unspecified finger: Secondary | ICD-10-CM | POA: Diagnosis not present

## 2019-05-11 DIAGNOSIS — Z79899 Other long term (current) drug therapy: Secondary | ICD-10-CM | POA: Diagnosis not present

## 2019-05-11 DIAGNOSIS — M2550 Pain in unspecified joint: Secondary | ICD-10-CM | POA: Diagnosis not present

## 2019-05-11 DIAGNOSIS — R768 Other specified abnormal immunological findings in serum: Secondary | ICD-10-CM | POA: Diagnosis not present

## 2019-05-11 DIAGNOSIS — M064 Inflammatory polyarthropathy: Secondary | ICD-10-CM | POA: Diagnosis not present

## 2019-05-11 DIAGNOSIS — M150 Primary generalized (osteo)arthritis: Secondary | ICD-10-CM | POA: Diagnosis not present

## 2019-06-09 ENCOUNTER — Other Ambulatory Visit: Payer: Self-pay

## 2019-06-09 ENCOUNTER — Ambulatory Visit (INDEPENDENT_AMBULATORY_CARE_PROVIDER_SITE_OTHER): Payer: Medicare Other | Admitting: Sports Medicine

## 2019-06-09 ENCOUNTER — Encounter: Payer: Self-pay | Admitting: Sports Medicine

## 2019-06-09 DIAGNOSIS — M79675 Pain in left toe(s): Secondary | ICD-10-CM | POA: Diagnosis not present

## 2019-06-09 DIAGNOSIS — M79674 Pain in right toe(s): Secondary | ICD-10-CM | POA: Diagnosis not present

## 2019-06-09 DIAGNOSIS — I739 Peripheral vascular disease, unspecified: Secondary | ICD-10-CM | POA: Diagnosis not present

## 2019-06-09 DIAGNOSIS — B351 Tinea unguium: Secondary | ICD-10-CM | POA: Diagnosis not present

## 2019-06-09 NOTE — Progress Notes (Signed)
Subjective: Katelyn Watson is a 74 y.o. female patient seen today in office with complaint of mildly painful thickened and elongated toenails; unable to trim. Reports that her right toenail has been doing ok, Patient request nail trim. Patient has no other pedal complaints at this time.   Patient Active Problem List   Diagnosis Date Noted  . Acquired trigger finger of left ring finger 03/03/2018  . Dry mouth 01/14/2018  . Laryngospasms 01/14/2018  . 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 Degeneration of lumbar intervertebral disc 09/12/2017  . Aromatase inhibitor use 08/06/2017  . Aftercare 06/13/2017  . Trigger finger of left hand 06/13/2017  . H/O Hashimoto thyroiditis 09/12/2016  . Asthma 08/02/2016  . Hypothyroidism 08/02/2016  . Morbid obesity ([place]) 01/19/2014  . Postoperative anemia due to acute blood loss 01/19/2014  . 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  . Breast cancer, stage 3 ([place]) 05/08/2011  . DIASTOLIC DYSFUNCTION [id number]  . HYPERLIPIDEMIA 11/28/2008  . HYPERTENSION 11/28/2008  . ASTHMA 11/28/2008  . DYSPNEA ON EXERTION 11/28/2008  . COUGH 11/28/2008    Current Outpatient Medications on File Prior to Visit  Medication Sig Dispense Refill  . albuterol (PROVENTIL HFA) 108 (90 BASE) MCG/ACT inhaler Inhale 1-2 puffs into the lungs every 6 (six) hours as needed for wheezing or shortness of breath.     Marland Kitchen albuterol (PROVENTIL) (2.5 MG/3ML) 0.083% nebulizer solution Take 2.5 mg by nebulization every 6 (six) hours as needed for wheezing or shortness of breath.    Marland Kitchen ammonium lactate (LAC-HYDRIN) 12 % lotion Apply topically.    Marland Kitchen atorvastatin (LIPITOR) 10 MG tablet Take 10 mg by mouth at bedtime.     . Calcium Carbonate-Vit D-Min (CALTRATE 600+D PLUS) 600-400 MG-UNIT per tablet Take 2 tablets by mouth daily.      . carvedilol (COREG) 12.5 MG tablet Take 12.5 mg by mouth 2 (two) times daily with a meal.     . diclofenac (VOLTAREN) 75 MG EC tablet Take by  mouth.    . fexofenadine (ALLEGRA) 180 MG tablet Take 180 mg by mouth daily.      . hydrochlorothiazide (HYDRODIURIL) 25 MG tablet Take 25 mg by mouth every morning.    Marland Kitchen letrozole (FEMARA) 2.5 MG tablet Take by mouth.    . levothyroxine (SYNTHROID, LEVOTHROID) 100 MCG tablet Take 100 mcg by mouth daily before breakfast.     . losartan (COZAAR) 100 MG tablet Take 100 mg by mouth at bedtime.     . Multiple Vitamins-Minerals (CENTRUM SILVER PO) Take 1 tablet by mouth daily.      Marland Kitchen omeprazole (PRILOSEC) 20 MG capsule Take 20 mg by mouth 2 (two) times daily.      . potassium chloride (K-DUR,KLOR-CON) 10 MEQ tablet Take 10 mEq by mouth 2 (two) times daily.    . sertraline (ZOLOFT) 50 MG tablet Take 100 mg by mouth every morning.     . torsemide (DEMADEX) 10 MG tablet Take 10 mg by mouth daily.    . traZODone (DESYREL) 150 MG tablet Take 150 mg by mouth at bedtime.      No current facility-administered medications on file prior to visit.    Allergies  Allergen Reactions  . Singulair [Montelukast Sodium]     Causes weakness   . Latex Rash  . Tape Rash    Objective: Physical Exam  General: Well developed, nourished, no acute distress, awake, alert and oriented x 3  Vascular: Dorsalis pedis artery 2/4 bilateral,  Posterior tibial artery 0/4 bilateral due to trace edema ankles, skin temperature warm to cool proximal to distal bilateral lower extremities, moderate varicosities, no pedal hair present bilateral.  Venous hyperpigmentation noted bilateral.  Neurological: Gross sensation present via light touch bilateral.   Dermatological: Skin is warm, dry, and supple bilateral, Nails 1-10 are tender, long, thick, and discolored with mild subungal debris with bilateral hallux nails most involved and the right hallux nail, no webspace macerations present bilateral, no open lesions present bilateral, no callus/corns/hyperkeratotic tissue present bilateral. No signs of infection  bilateral.  Musculoskeletal: Asymptomatic hammertoe boney deformities noted bilateral. Muscular strength within normal limits without painon range of motion. No pain with calf compression bilateral.  Assessment and Plan:  Problem List Items Addressed This Visit    None    Visit Diagnoses    Pain due to onychomycosis of toenails of both feet    -  Primary   PVD (peripheral vascular disease) ([place])         -Examined patient.  -ABN signed -Discussed treatment options for painful mycotic nails. -Mechanically debrided and reduced mycotic nails with sterile nail nipper and dremel nail file without incident. -Patient to return in 3 months for follow up evaluation or sooner if symptoms worsen.  Landis Martins, DPM

## 2019-07-06 DIAGNOSIS — H401131 Primary open-angle glaucoma, bilateral, mild stage: Secondary | ICD-10-CM | POA: Diagnosis not present

## 2019-09-16 ENCOUNTER — Other Ambulatory Visit: Payer: Self-pay

## 2019-09-16 ENCOUNTER — Ambulatory Visit (INDEPENDENT_AMBULATORY_CARE_PROVIDER_SITE_OTHER): Payer: Medicare Other | Admitting: Sports Medicine

## 2019-09-16 ENCOUNTER — Encounter: Payer: Self-pay | Admitting: Sports Medicine

## 2019-09-16 DIAGNOSIS — M79675 Pain in left toe(s): Secondary | ICD-10-CM

## 2019-09-16 DIAGNOSIS — B351 Tinea unguium: Secondary | ICD-10-CM

## 2019-09-16 DIAGNOSIS — I739 Peripheral vascular disease, unspecified: Secondary | ICD-10-CM

## 2019-09-16 DIAGNOSIS — M79674 Pain in right toe(s): Secondary | ICD-10-CM | POA: Diagnosis not present

## 2019-09-16 NOTE — Progress Notes (Signed)
Subjective: Katelyn Watson is a 74 y.o. female patient seen today in office with complaint of mildly painful thickened and elongated toenails; unable to trim.  Patient denies any changes with medication or health history since last encounter.  Patient reports that she is still on aspirin takes 81 mg every other day.  Patient Active Problem List   Diagnosis Date Noted  . Acquired trigger finger of left ring finger 03/03/2018  . Dry mouth 01/14/2018  . Laryngospasms 01/14/2018  . 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 Degeneration of lumbar intervertebral disc 09/12/2017  . Aromatase inhibitor use 08/06/2017  . Aftercare 06/13/2017  . Trigger finger of left hand 06/13/2017  . H/O Hashimoto thyroiditis 09/12/2016  . Asthma 08/02/2016  . Hypothyroidism 08/02/2016  . Morbid obesity ([place]) 01/19/2014  . Postoperative anemia due to acute blood loss 01/19/2014  . 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  . Breast cancer, stage 3 ([place]) 05/08/2011  . DIASTOLIC DYSFUNCTION [id number]  . HYPERLIPIDEMIA 11/28/2008  . HYPERTENSION 11/28/2008  . ASTHMA 11/28/2008  . DYSPNEA ON EXERTION 11/28/2008  . COUGH 11/28/2008    Current Outpatient Medications on File Prior to Visit  Medication Sig Dispense Refill  . albuterol (PROVENTIL HFA) 108 (90 BASE) MCG/ACT inhaler Inhale 1-2 puffs into the lungs every 6 (six) hours as needed for wheezing or shortness of breath.     Marland Kitchen albuterol (PROVENTIL) (2.5 MG/3ML) 0.083% nebulizer solution Take 2.5 mg by nebulization every 6 (six) hours as needed for wheezing or shortness of breath.    Marland Kitchen ammonium lactate (LAC-HYDRIN) 12 % lotion Apply topically.    Marland Kitchen atorvastatin (LIPITOR) 10 MG tablet Take 10 mg by mouth at bedtime.     . Calcium Carbonate-Vit D-Min (CALTRATE 600+D PLUS) 600-400 MG-UNIT per tablet Take 2 tablets by mouth daily.      . carvedilol (COREG) 12.5 MG tablet Take 12.5 mg by mouth 2 (two) times daily with a meal.     . diclofenac  (VOLTAREN) 75 MG EC tablet Take by mouth.    . fexofenadine (ALLEGRA) 180 MG tablet Take 180 mg by mouth daily.      . hydrochlorothiazide (HYDRODIURIL) 25 MG tablet Take 25 mg by mouth every morning.    Marland Kitchen letrozole (FEMARA) 2.5 MG tablet Take by mouth.    . levothyroxine (SYNTHROID, LEVOTHROID) 100 MCG tablet Take 100 mcg by mouth daily before breakfast.     . losartan (COZAAR) 100 MG tablet Take 100 mg by mouth at bedtime.     . Multiple Vitamins-Minerals (CENTRUM SILVER PO) Take 1 tablet by mouth daily.      Marland Kitchen omeprazole (PRILOSEC) 20 MG capsule Take 20 mg by mouth 2 (two) times daily.      . potassium chloride (K-DUR,KLOR-CON) 10 MEQ tablet Take 10 mEq by mouth 2 (two) times daily.    . sertraline (ZOLOFT) 50 MG tablet Take 100 mg by mouth every morning.     . torsemide (DEMADEX) 10 MG tablet Take 10 mg by mouth daily.    . traZODone (DESYREL) 150 MG tablet Take 150 mg by mouth at bedtime.      No current facility-administered medications on file prior to visit.    Allergies  Allergen Reactions  . Singulair [Montelukast Sodium]     Causes weakness   . Latex Rash  . Tape Rash    Objective: Physical Exam  General: Well developed, nourished, no acute distress, awake, alert and oriented x 3  Vascular:  Dorsalis pedis artery 2/4 bilateral, Posterior tibial artery 0/4 bilateral due to trace edema ankles, skin temperature warm to cool proximal to distal bilateral lower extremities, moderate varicosities, no pedal hair present bilateral.  Venous hyperpigmentation noted bilateral.  Neurological: Gross sensation present via light touch bilateral.   Dermatological: Skin is warm, dry, and supple bilateral, Nails 1-10 are tender, long, thick, and discolored with mild subungal debris with bilateral hallux nails most involved and the right hallux nail, no webspace macerations present bilateral, no open lesions present bilateral, no callus/corns/hyperkeratotic tissue present bilateral. No signs  of infection bilateral.  Musculoskeletal: Asymptomatic hammertoe boney deformities noted bilateral. Muscular strength within normal limits without painon range of motion. No pain with calf compression bilateral.  Assessment and Plan:  Problem List Items Addressed This Visit    None    Visit Diagnoses    Pain due to onychomycosis of toenails of both feet    -  Primary   PVD (peripheral vascular disease) ([place])         -Examined patient.  -ABN on file from previous -Re-Discussed treatment options for painful mycotic nails. -Mechanically debrided and reduced mycotic nails with sterile nail nipper and dremel nail file without incident. -Patient to return in 3 months for follow up evaluation or sooner if symptoms worsen.  Landis Martins, DPM

## 2019-10-11 DIAGNOSIS — Z9011 Acquired absence of right breast and nipple: Secondary | ICD-10-CM | POA: Diagnosis not present

## 2019-10-11 DIAGNOSIS — Z853 Personal history of malignant neoplasm of breast: Secondary | ICD-10-CM | POA: Diagnosis not present

## 2019-10-11 DIAGNOSIS — R109 Unspecified abdominal pain: Secondary | ICD-10-CM | POA: Diagnosis not present

## 2019-10-11 DIAGNOSIS — C50911 Malignant neoplasm of unspecified site of right female breast: Secondary | ICD-10-CM | POA: Diagnosis not present

## 2019-10-11 DIAGNOSIS — Z170 Estrogen receptor positive status [ER+]: Secondary | ICD-10-CM | POA: Diagnosis not present

## 2019-10-11 DIAGNOSIS — Z08 Encounter for follow-up examination after completed treatment for malignant neoplasm: Secondary | ICD-10-CM | POA: Diagnosis not present

## 2019-10-11 DIAGNOSIS — M2550 Pain in unspecified joint: Secondary | ICD-10-CM | POA: Diagnosis not present

## 2019-10-11 DIAGNOSIS — M8580 Other specified disorders of bone density and structure, unspecified site: Secondary | ICD-10-CM | POA: Diagnosis not present

## 2019-10-11 DIAGNOSIS — R5383 Other fatigue: Secondary | ICD-10-CM | POA: Diagnosis not present

## 2019-10-11 DIAGNOSIS — Z79899 Other long term (current) drug therapy: Secondary | ICD-10-CM | POA: Diagnosis not present

## 2019-10-11 DIAGNOSIS — K5792 Diverticulitis of intestine, part unspecified, without perforation or abscess without bleeding: Secondary | ICD-10-CM | POA: Diagnosis not present

## 2019-10-19 DIAGNOSIS — N281 Cyst of kidney, acquired: Secondary | ICD-10-CM | POA: Diagnosis not present

## 2019-10-19 DIAGNOSIS — N200 Calculus of kidney: Secondary | ICD-10-CM | POA: Diagnosis not present

## 2019-11-04 DIAGNOSIS — H04123 Dry eye syndrome of bilateral lacrimal glands: Secondary | ICD-10-CM | POA: Diagnosis not present

## 2019-11-04 DIAGNOSIS — H401131 Primary open-angle glaucoma, bilateral, mild stage: Secondary | ICD-10-CM | POA: Diagnosis not present

## 2019-11-05 DIAGNOSIS — E785 Hyperlipidemia, unspecified: Secondary | ICD-10-CM | POA: Diagnosis not present

## 2019-11-05 DIAGNOSIS — K219 Gastro-esophageal reflux disease without esophagitis: Secondary | ICD-10-CM | POA: Diagnosis not present

## 2019-11-05 DIAGNOSIS — I10 Essential (primary) hypertension: Secondary | ICD-10-CM | POA: Diagnosis not present

## 2019-11-05 DIAGNOSIS — I872 Venous insufficiency (chronic) (peripheral): Secondary | ICD-10-CM | POA: Diagnosis not present

## 2019-11-05 DIAGNOSIS — E039 Hypothyroidism, unspecified: Secondary | ICD-10-CM | POA: Diagnosis not present

## 2019-11-05 DIAGNOSIS — Z1331 Encounter for screening for depression: Secondary | ICD-10-CM | POA: Diagnosis not present

## 2019-11-05 DIAGNOSIS — M150 Primary generalized (osteo)arthritis: Secondary | ICD-10-CM | POA: Diagnosis not present

## 2019-11-09 DIAGNOSIS — M2550 Pain in unspecified joint: Secondary | ICD-10-CM | POA: Diagnosis not present

## 2019-11-09 DIAGNOSIS — R768 Other specified abnormal immunological findings in serum: Secondary | ICD-10-CM | POA: Diagnosis not present

## 2019-11-09 DIAGNOSIS — M150 Primary generalized (osteo)arthritis: Secondary | ICD-10-CM | POA: Diagnosis not present

## 2019-11-09 DIAGNOSIS — E669 Obesity, unspecified: Secondary | ICD-10-CM | POA: Diagnosis not present

## 2019-11-09 DIAGNOSIS — Z6839 Body mass index (BMI) 39.0-39.9, adult: Secondary | ICD-10-CM | POA: Diagnosis not present

## 2019-11-09 DIAGNOSIS — M064 Inflammatory polyarthropathy: Secondary | ICD-10-CM | POA: Diagnosis not present

## 2019-11-09 DIAGNOSIS — Z79899 Other long term (current) drug therapy: Secondary | ICD-10-CM | POA: Diagnosis not present

## 2019-12-16 ENCOUNTER — Ambulatory Visit (INDEPENDENT_AMBULATORY_CARE_PROVIDER_SITE_OTHER): Payer: Medicare Other | Admitting: Sports Medicine

## 2019-12-16 ENCOUNTER — Other Ambulatory Visit: Payer: Self-pay

## 2019-12-16 ENCOUNTER — Encounter: Payer: Self-pay | Admitting: Sports Medicine

## 2019-12-16 DIAGNOSIS — M79674 Pain in right toe(s): Secondary | ICD-10-CM | POA: Diagnosis not present

## 2019-12-16 DIAGNOSIS — I739 Peripheral vascular disease, unspecified: Secondary | ICD-10-CM

## 2019-12-16 DIAGNOSIS — M79675 Pain in left toe(s): Secondary | ICD-10-CM

## 2019-12-16 DIAGNOSIS — B351 Tinea unguium: Secondary | ICD-10-CM | POA: Diagnosis not present

## 2019-12-16 NOTE — Progress Notes (Signed)
Subjective: Katelyn Watson is a 74 y.o. female patient seen today in office with complaint of mildly painful thickened and elongated toenails; unable to trim.  Patient denies any changes with medication or health history since last encounter.  Still taking baby aspirin.  Patient Active Problem List   Diagnosis Date Noted  . Acquired trigger finger of left ring finger 03/03/2018  . Dry mouth 01/14/2018  . Laryngospasms 01/14/2018  . 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 Degeneration of lumbar intervertebral disc 09/12/2017  . Aromatase inhibitor use 08/06/2017  . Aftercare 06/13/2017  . Trigger finger of left hand 06/13/2017  . H/O Hashimoto thyroiditis 09/12/2016  . Asthma 08/02/2016  . Hypothyroidism 08/02/2016  . Morbid obesity ([place]) 01/19/2014  . Postoperative anemia due to acute blood loss 01/19/2014  . 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  . Breast cancer, stage 3 ([place]) 05/08/2011  . DIASTOLIC DYSFUNCTION 95/63/8756  . HYPERLIPIDEMIA 11/28/2008  . HYPERTENSION 11/28/2008  . ASTHMA 11/28/2008  . DYSPNEA ON EXERTION 11/28/2008  . COUGH 11/28/2008    Current Outpatient Medications on File Prior to Visit  Medication Sig Dispense Refill  . albuterol (PROVENTIL HFA) 108 (90 BASE) MCG/ACT inhaler Inhale 1-2 puffs into the lungs every 6 (six) hours as needed for wheezing or shortness of breath.     Marland Kitchen albuterol (PROVENTIL) (2.5 MG/3ML) 0.083% nebulizer solution Take 2.5 mg by nebulization every 6 (six) hours as needed for wheezing or shortness of breath.    Marland Kitchen ammonium lactate (LAC-HYDRIN) 12 % lotion Apply topically.    Marland Kitchen atorvastatin (LIPITOR) 10 MG tablet Take 10 mg by mouth at bedtime.     . Calcium Carbonate-Vit D-Min (CALTRATE 600+D PLUS) 600-400 MG-UNIT per tablet Take 2 tablets by mouth daily.      . carvedilol (COREG) 12.5 MG tablet Take 12.5 mg by mouth 2 (two) times daily with a meal.     . diclofenac (VOLTAREN) 75 MG EC tablet Take by mouth.    .  fexofenadine (ALLEGRA) 180 MG tablet Take 180 mg by mouth daily.      . fluconazole (DIFLUCAN) 150 MG tablet Take 150 mg by mouth daily.    . hydrochlorothiazide (HYDRODIURIL) 25 MG tablet Take 25 mg by mouth every morning.    . hyoscyamine (ANASPAZ) 0.125 MG TBDP disintergrating tablet Take 0.125 mg by mouth every 4 (four) hours as needed.    Marland Kitchen letrozole (FEMARA) 2.5 MG tablet Take by mouth.    . levothyroxine (SYNTHROID, LEVOTHROID) 100 MCG tablet Take 100 mcg by mouth daily before breakfast.     . losartan (COZAAR) 100 MG tablet Take 100 mg by mouth at bedtime.     . Multiple Vitamins-Minerals (CENTRUM SILVER PO) Take 1 tablet by mouth daily.      Marland Kitchen omeprazole (PRILOSEC) 20 MG capsule Take 20 mg by mouth 2 (two) times daily.      . potassium chloride (K-DUR,KLOR-CON) 10 MEQ tablet Take 10 mEq by mouth 2 (two) times daily.    . sertraline (ZOLOFT) 50 MG tablet Take 100 mg by mouth every morning.     . torsemide (DEMADEX) 10 MG tablet Take 10 mg by mouth daily.    . traZODone (DESYREL) 150 MG tablet Take 150 mg by mouth at bedtime.      No current facility-administered medications on file prior to visit.    Allergies  Allergen Reactions  . Singulair [Montelukast Sodium]     Causes weakness   . Latex  Rash  . Tape Rash    Objective: Physical Exam  General: Well developed, nourished, no acute distress, awake, alert and oriented x 3  Vascular: Dorsalis pedis artery 2/4 bilateral, Posterior tibial artery 0/4 bilateral due to trace edema ankles, skin temperature warm to cool proximal to distal bilateral lower extremities, moderate varicosities, no pedal hair present bilateral.  Venous hyperpigmentation noted bilateral.  Neurological: Gross sensation present via light touch bilateral.   Dermatological: Skin is warm, dry, and supple bilateral, Nails 1-10 are tender, long, thick, and discolored with mild subungal debris with bilateral hallux nails most involved and the right hallux nail,  no webspace macerations present bilateral, no open lesions present bilateral, no callus/corns/hyperkeratotic tissue present bilateral. No signs of infection bilateral.  Musculoskeletal: Asymptomatic hammertoe boney deformities noted bilateral. Muscular strength within normal limits without painon range of motion. No pain with calf compression bilateral.  Assessment and Plan:  Problem List Items Addressed This Visit    None    Visit Diagnoses    Pain due to onychomycosis of toenails of both feet    -  Primary   Relevant Medications   fluconazole (DIFLUCAN) 150 MG tablet   PVD (peripheral vascular disease) ([place])         -Examined patient.  -Re-Discussed treatment options for painful mycotic nails. -Mechanically debrided and reduced mycotic nails with sterile nail nipper and dremel nail file without incident. -Patient to return in 3 months for follow up evaluation or sooner if symptoms worsen.  Landis Martins, DPM

## 2020-03-06 DIAGNOSIS — H401131 Primary open-angle glaucoma, bilateral, mild stage: Secondary | ICD-10-CM | POA: Diagnosis not present

## 2020-03-06 DIAGNOSIS — H04123 Dry eye syndrome of bilateral lacrimal glands: Secondary | ICD-10-CM | POA: Diagnosis not present

## 2020-03-17 ENCOUNTER — Other Ambulatory Visit: Payer: Self-pay

## 2020-03-17 ENCOUNTER — Encounter: Payer: Self-pay | Admitting: Sports Medicine

## 2020-03-17 ENCOUNTER — Ambulatory Visit (INDEPENDENT_AMBULATORY_CARE_PROVIDER_SITE_OTHER): Payer: Medicare Other | Admitting: Sports Medicine

## 2020-03-17 DIAGNOSIS — M79674 Pain in right toe(s): Secondary | ICD-10-CM

## 2020-03-17 DIAGNOSIS — I739 Peripheral vascular disease, unspecified: Secondary | ICD-10-CM | POA: Diagnosis not present

## 2020-03-17 DIAGNOSIS — M79675 Pain in left toe(s): Secondary | ICD-10-CM

## 2020-03-17 DIAGNOSIS — B351 Tinea unguium: Secondary | ICD-10-CM | POA: Diagnosis not present

## 2020-03-17 DIAGNOSIS — L603 Nail dystrophy: Secondary | ICD-10-CM

## 2020-03-17 NOTE — Progress Notes (Signed)
Subjective: Katelyn Watson is a 74 y.o. female patient seen today in office with complaint of mildly painful thickened and elongated toenails; unable to trim.  Patient denies any changes with medication or health history since last encounter but has been swelling on her legs. No other pedal complaints noted.  Patient Active Problem List   Diagnosis Date Noted  . Acquired trigger finger of left ring finger 03/03/2018  . Dry mouth 01/14/2018  . Laryngospasms 01/14/2018  . 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 Degeneration of lumbar intervertebral disc 09/12/2017  . Aromatase inhibitor use 08/06/2017  . Aftercare 06/13/2017  . Trigger finger of left hand 06/13/2017  . H/O Hashimoto thyroiditis 09/12/2016  . Asthma 08/02/2016  . Hypothyroidism 08/02/2016  . Morbid obesity ([place]) 01/19/2014  . Postoperative anemia due to acute blood loss 01/19/2014  . 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  . Breast cancer, stage 3 ([place]) 05/08/2011  . DIASTOLIC DYSFUNCTION 23/53/6144  . HYPERLIPIDEMIA 11/28/2008  . HYPERTENSION 11/28/2008  . ASTHMA 11/28/2008  . DYSPNEA ON EXERTION 11/28/2008  . COUGH 11/28/2008    Current Outpatient Medications on File Prior to Visit  Medication Sig Dispense Refill  . albuterol (PROVENTIL HFA) 108 (90 BASE) MCG/ACT inhaler Inhale 1-2 puffs into the lungs every 6 (six) hours as needed for wheezing or shortness of breath.     Marland Kitchen albuterol (PROVENTIL) (2.5 MG/3ML) 0.083% nebulizer solution Take 2.5 mg by nebulization every 6 (six) hours as needed for wheezing or shortness of breath.    Marland Kitchen ammonium lactate (LAC-HYDRIN) 12 % lotion Apply topically.    Marland Kitchen atorvastatin (LIPITOR) 10 MG tablet Take 10 mg by mouth at bedtime.     . Calcium Carbonate-Vit D-Min (CALTRATE 600+D PLUS) 600-400 MG-UNIT per tablet Take 2 tablets by mouth daily.      . carvedilol (COREG) 12.5 MG tablet Take 12.5 mg by mouth 2 (two) times daily with a meal.     . diclofenac (VOLTAREN) 75  MG EC tablet Take by mouth.    . fexofenadine (ALLEGRA) 180 MG tablet Take 180 mg by mouth daily.      . fluconazole (DIFLUCAN) 150 MG tablet Take 150 mg by mouth daily.    . hydrochlorothiazide (HYDRODIURIL) 25 MG tablet Take 25 mg by mouth every morning.    . hyoscyamine (ANASPAZ) 0.125 MG TBDP disintergrating tablet Take 0.125 mg by mouth every 4 (four) hours as needed.    Marland Kitchen letrozole (FEMARA) 2.5 MG tablet Take by mouth.    . levothyroxine (SYNTHROID, LEVOTHROID) 100 MCG tablet Take 100 mcg by mouth daily before breakfast.     . losartan (COZAAR) 100 MG tablet Take 100 mg by mouth at bedtime.     . Multiple Vitamins-Minerals (CENTRUM SILVER PO) Take 1 tablet by mouth daily.      Marland Kitchen omeprazole (PRILOSEC) 20 MG capsule Take 20 mg by mouth 2 (two) times daily.      . potassium chloride (K-DUR,KLOR-CON) 10 MEQ tablet Take 10 mEq by mouth 2 (two) times daily.    . sertraline (ZOLOFT) 50 MG tablet Take 100 mg by mouth every morning.     . torsemide (DEMADEX) 10 MG tablet Take 10 mg by mouth daily.    . traZODone (DESYREL) 150 MG tablet Take 150 mg by mouth at bedtime.      No current facility-administered medications on file prior to visit.    Allergies  Allergen Reactions  . Singulair [Montelukast Sodium]  Causes weakness   . Latex Rash  . Tape Rash    Objective: Physical Exam  General: Well developed, nourished, no acute distress, awake, alert and oriented x 3  Vascular: Dorsalis pedis artery 1/4 bilateral, Posterior tibial artery 0/4 bilateral due to trace edema ankles, skin temperature warm to cool proximal to distal bilateral lower extremities, moderate varicosities, no pedal hair present bilateral.  Venous hyperpigmentation noted bilateral.  Neurological: Gross sensation present via light touch bilateral.   Dermatological: Skin is warm, dry, and supple bilateral, Nails 1-10 are tender, long, thick, and discolored with mild subungal debris with bilateral hallux nails most  involved and the right hallux nail, no webspace macerations present bilateral, no open lesions present bilateral, no callus/corns/hyperkeratotic tissue present bilateral. No signs of infection bilateral.  Musculoskeletal: Asymptomatic hammertoe boney deformities noted bilateral. Muscular strength within normal limits without painon range of motion. No pain with calf compression bilateral.  Assessment and Plan:  Problem List Items Addressed This Visit    None    Visit Diagnoses    Pain due to onychomycosis of toenails of both feet    -  Primary   PVD (peripheral vascular disease) ([place])       Nail dystrophy       Toe pain, bilateral         -Examined patient.  -Re-Discussed treatment options for painful mycotic nails. -Mechanically debrided and reduced mycotic nails with sterile nail nipper and dremel nail file without incident. -Dispensed surgitube compression sleeves to wear bilateral and encouraged elevation of legs to assist with edema control -Patient to return in 3 months for follow up evaluation or sooner if symptoms worsen.  Landis Martins, DPM

## 2020-04-17 DIAGNOSIS — M8580 Other specified disorders of bone density and structure, unspecified site: Secondary | ICD-10-CM | POA: Diagnosis not present

## 2020-04-17 DIAGNOSIS — Z79899 Other long term (current) drug therapy: Secondary | ICD-10-CM | POA: Diagnosis not present

## 2020-04-17 DIAGNOSIS — Z170 Estrogen receptor positive status [ER+]: Secondary | ICD-10-CM | POA: Diagnosis not present

## 2020-04-17 DIAGNOSIS — Z9181 History of falling: Secondary | ICD-10-CM | POA: Diagnosis not present

## 2020-04-17 DIAGNOSIS — R5383 Other fatigue: Secondary | ICD-10-CM | POA: Diagnosis not present

## 2020-04-17 DIAGNOSIS — M8589 Other specified disorders of bone density and structure, multiple sites: Secondary | ICD-10-CM | POA: Diagnosis not present

## 2020-04-17 DIAGNOSIS — C50911 Malignant neoplasm of unspecified site of right female breast: Secondary | ICD-10-CM | POA: Diagnosis not present

## 2020-04-17 DIAGNOSIS — Z79811 Long term (current) use of aromatase inhibitors: Secondary | ICD-10-CM | POA: Diagnosis not present

## 2020-05-01 DIAGNOSIS — C50912 Malignant neoplasm of unspecified site of left female breast: Secondary | ICD-10-CM | POA: Diagnosis not present

## 2020-05-01 DIAGNOSIS — R2689 Other abnormalities of gait and mobility: Secondary | ICD-10-CM | POA: Diagnosis not present

## 2020-05-01 DIAGNOSIS — M62551 Muscle wasting and atrophy, not elsewhere classified, right thigh: Secondary | ICD-10-CM | POA: Diagnosis not present

## 2020-05-01 DIAGNOSIS — M62552 Muscle wasting and atrophy, not elsewhere classified, left thigh: Secondary | ICD-10-CM | POA: Diagnosis not present

## 2020-05-03 DIAGNOSIS — M62551 Muscle wasting and atrophy, not elsewhere classified, right thigh: Secondary | ICD-10-CM | POA: Diagnosis not present

## 2020-05-03 DIAGNOSIS — C50912 Malignant neoplasm of unspecified site of left female breast: Secondary | ICD-10-CM | POA: Diagnosis not present

## 2020-05-03 DIAGNOSIS — R2689 Other abnormalities of gait and mobility: Secondary | ICD-10-CM | POA: Diagnosis not present

## 2020-05-03 DIAGNOSIS — M62552 Muscle wasting and atrophy, not elsewhere classified, left thigh: Secondary | ICD-10-CM | POA: Diagnosis not present

## 2020-05-08 DIAGNOSIS — M62551 Muscle wasting and atrophy, not elsewhere classified, right thigh: Secondary | ICD-10-CM | POA: Diagnosis not present

## 2020-05-08 DIAGNOSIS — R2689 Other abnormalities of gait and mobility: Secondary | ICD-10-CM | POA: Diagnosis not present

## 2020-05-08 DIAGNOSIS — C50912 Malignant neoplasm of unspecified site of left female breast: Secondary | ICD-10-CM | POA: Diagnosis not present

## 2020-05-08 DIAGNOSIS — M62552 Muscle wasting and atrophy, not elsewhere classified, left thigh: Secondary | ICD-10-CM | POA: Diagnosis not present

## 2020-05-10 DIAGNOSIS — R768 Other specified abnormal immunological findings in serum: Secondary | ICD-10-CM | POA: Diagnosis not present

## 2020-05-10 DIAGNOSIS — E669 Obesity, unspecified: Secondary | ICD-10-CM | POA: Diagnosis not present

## 2020-05-10 DIAGNOSIS — M150 Primary generalized (osteo)arthritis: Secondary | ICD-10-CM | POA: Diagnosis not present

## 2020-05-10 DIAGNOSIS — Z6839 Body mass index (BMI) 39.0-39.9, adult: Secondary | ICD-10-CM | POA: Diagnosis not present

## 2020-05-10 DIAGNOSIS — M2550 Pain in unspecified joint: Secondary | ICD-10-CM | POA: Diagnosis not present

## 2020-05-10 DIAGNOSIS — Z79899 Other long term (current) drug therapy: Secondary | ICD-10-CM | POA: Diagnosis not present

## 2020-05-11 DIAGNOSIS — C50912 Malignant neoplasm of unspecified site of left female breast: Secondary | ICD-10-CM | POA: Diagnosis not present

## 2020-05-11 DIAGNOSIS — M62551 Muscle wasting and atrophy, not elsewhere classified, right thigh: Secondary | ICD-10-CM | POA: Diagnosis not present

## 2020-05-11 DIAGNOSIS — R2689 Other abnormalities of gait and mobility: Secondary | ICD-10-CM | POA: Diagnosis not present

## 2020-05-11 DIAGNOSIS — M62552 Muscle wasting and atrophy, not elsewhere classified, left thigh: Secondary | ICD-10-CM | POA: Diagnosis not present

## 2020-05-16 DIAGNOSIS — C50912 Malignant neoplasm of unspecified site of left female breast: Secondary | ICD-10-CM | POA: Diagnosis not present

## 2020-05-16 DIAGNOSIS — M62551 Muscle wasting and atrophy, not elsewhere classified, right thigh: Secondary | ICD-10-CM | POA: Diagnosis not present

## 2020-05-16 DIAGNOSIS — M62552 Muscle wasting and atrophy, not elsewhere classified, left thigh: Secondary | ICD-10-CM | POA: Diagnosis not present

## 2020-05-16 DIAGNOSIS — R2689 Other abnormalities of gait and mobility: Secondary | ICD-10-CM | POA: Diagnosis not present

## 2020-05-22 DIAGNOSIS — C50919 Malignant neoplasm of unspecified site of unspecified female breast: Secondary | ICD-10-CM | POA: Diagnosis not present

## 2020-05-22 DIAGNOSIS — Z79811 Long term (current) use of aromatase inhibitors: Secondary | ICD-10-CM | POA: Diagnosis not present

## 2020-05-22 DIAGNOSIS — M85852 Other specified disorders of bone density and structure, left thigh: Secondary | ICD-10-CM | POA: Diagnosis not present

## 2020-05-23 DIAGNOSIS — M62552 Muscle wasting and atrophy, not elsewhere classified, left thigh: Secondary | ICD-10-CM | POA: Diagnosis not present

## 2020-05-23 DIAGNOSIS — R2689 Other abnormalities of gait and mobility: Secondary | ICD-10-CM | POA: Diagnosis not present

## 2020-05-23 DIAGNOSIS — M62551 Muscle wasting and atrophy, not elsewhere classified, right thigh: Secondary | ICD-10-CM | POA: Diagnosis not present

## 2020-05-23 DIAGNOSIS — C50912 Malignant neoplasm of unspecified site of left female breast: Secondary | ICD-10-CM | POA: Diagnosis not present

## 2020-05-29 DIAGNOSIS — M62552 Muscle wasting and atrophy, not elsewhere classified, left thigh: Secondary | ICD-10-CM | POA: Diagnosis not present

## 2020-05-29 DIAGNOSIS — R2689 Other abnormalities of gait and mobility: Secondary | ICD-10-CM | POA: Diagnosis not present

## 2020-05-29 DIAGNOSIS — C50912 Malignant neoplasm of unspecified site of left female breast: Secondary | ICD-10-CM | POA: Diagnosis not present

## 2020-05-29 DIAGNOSIS — M62551 Muscle wasting and atrophy, not elsewhere classified, right thigh: Secondary | ICD-10-CM | POA: Diagnosis not present

## 2020-05-30 DIAGNOSIS — Z23 Encounter for immunization: Secondary | ICD-10-CM | POA: Diagnosis not present

## 2020-05-31 DIAGNOSIS — R2689 Other abnormalities of gait and mobility: Secondary | ICD-10-CM | POA: Diagnosis not present

## 2020-05-31 DIAGNOSIS — C50912 Malignant neoplasm of unspecified site of left female breast: Secondary | ICD-10-CM | POA: Diagnosis not present

## 2020-05-31 DIAGNOSIS — M62551 Muscle wasting and atrophy, not elsewhere classified, right thigh: Secondary | ICD-10-CM | POA: Diagnosis not present

## 2020-05-31 DIAGNOSIS — M62552 Muscle wasting and atrophy, not elsewhere classified, left thigh: Secondary | ICD-10-CM | POA: Diagnosis not present

## 2020-06-08 DIAGNOSIS — C50912 Malignant neoplasm of unspecified site of left female breast: Secondary | ICD-10-CM | POA: Diagnosis not present

## 2020-06-08 DIAGNOSIS — M62552 Muscle wasting and atrophy, not elsewhere classified, left thigh: Secondary | ICD-10-CM | POA: Diagnosis not present

## 2020-06-08 DIAGNOSIS — R2689 Other abnormalities of gait and mobility: Secondary | ICD-10-CM | POA: Diagnosis not present

## 2020-06-08 DIAGNOSIS — M62551 Muscle wasting and atrophy, not elsewhere classified, right thigh: Secondary | ICD-10-CM | POA: Diagnosis not present

## 2020-06-13 DIAGNOSIS — M62551 Muscle wasting and atrophy, not elsewhere classified, right thigh: Secondary | ICD-10-CM | POA: Diagnosis not present

## 2020-06-13 DIAGNOSIS — M62552 Muscle wasting and atrophy, not elsewhere classified, left thigh: Secondary | ICD-10-CM | POA: Diagnosis not present

## 2020-06-13 DIAGNOSIS — R2689 Other abnormalities of gait and mobility: Secondary | ICD-10-CM | POA: Diagnosis not present

## 2020-06-13 DIAGNOSIS — C50912 Malignant neoplasm of unspecified site of left female breast: Secondary | ICD-10-CM | POA: Diagnosis not present

## 2020-06-15 DIAGNOSIS — M62552 Muscle wasting and atrophy, not elsewhere classified, left thigh: Secondary | ICD-10-CM | POA: Diagnosis not present

## 2020-06-15 DIAGNOSIS — C50912 Malignant neoplasm of unspecified site of left female breast: Secondary | ICD-10-CM | POA: Diagnosis not present

## 2020-06-15 DIAGNOSIS — M62551 Muscle wasting and atrophy, not elsewhere classified, right thigh: Secondary | ICD-10-CM | POA: Diagnosis not present

## 2020-06-15 DIAGNOSIS — R2689 Other abnormalities of gait and mobility: Secondary | ICD-10-CM | POA: Diagnosis not present

## 2020-06-20 ENCOUNTER — Ambulatory Visit: Payer: Medicare Other | Admitting: Sports Medicine

## 2020-06-20 DIAGNOSIS — C50912 Malignant neoplasm of unspecified site of left female breast: Secondary | ICD-10-CM | POA: Diagnosis not present

## 2020-06-20 DIAGNOSIS — M62551 Muscle wasting and atrophy, not elsewhere classified, right thigh: Secondary | ICD-10-CM | POA: Diagnosis not present

## 2020-06-20 DIAGNOSIS — R2689 Other abnormalities of gait and mobility: Secondary | ICD-10-CM | POA: Diagnosis not present

## 2020-06-20 DIAGNOSIS — M62552 Muscle wasting and atrophy, not elsewhere classified, left thigh: Secondary | ICD-10-CM | POA: Diagnosis not present

## 2020-06-22 DIAGNOSIS — M62552 Muscle wasting and atrophy, not elsewhere classified, left thigh: Secondary | ICD-10-CM | POA: Diagnosis not present

## 2020-06-22 DIAGNOSIS — C50912 Malignant neoplasm of unspecified site of left female breast: Secondary | ICD-10-CM | POA: Diagnosis not present

## 2020-06-22 DIAGNOSIS — R2689 Other abnormalities of gait and mobility: Secondary | ICD-10-CM | POA: Diagnosis not present

## 2020-06-22 DIAGNOSIS — M62551 Muscle wasting and atrophy, not elsewhere classified, right thigh: Secondary | ICD-10-CM | POA: Diagnosis not present

## 2020-06-27 DIAGNOSIS — M62551 Muscle wasting and atrophy, not elsewhere classified, right thigh: Secondary | ICD-10-CM | POA: Diagnosis not present

## 2020-06-27 DIAGNOSIS — M62552 Muscle wasting and atrophy, not elsewhere classified, left thigh: Secondary | ICD-10-CM | POA: Diagnosis not present

## 2020-06-27 DIAGNOSIS — C50912 Malignant neoplasm of unspecified site of left female breast: Secondary | ICD-10-CM | POA: Diagnosis not present

## 2020-06-27 DIAGNOSIS — R2689 Other abnormalities of gait and mobility: Secondary | ICD-10-CM | POA: Diagnosis not present

## 2020-06-29 DIAGNOSIS — M62551 Muscle wasting and atrophy, not elsewhere classified, right thigh: Secondary | ICD-10-CM | POA: Diagnosis not present

## 2020-06-29 DIAGNOSIS — R2689 Other abnormalities of gait and mobility: Secondary | ICD-10-CM | POA: Diagnosis not present

## 2020-06-29 DIAGNOSIS — M62552 Muscle wasting and atrophy, not elsewhere classified, left thigh: Secondary | ICD-10-CM | POA: Diagnosis not present

## 2020-06-29 DIAGNOSIS — C50912 Malignant neoplasm of unspecified site of left female breast: Secondary | ICD-10-CM | POA: Diagnosis not present

## 2020-07-03 DIAGNOSIS — R2689 Other abnormalities of gait and mobility: Secondary | ICD-10-CM | POA: Diagnosis not present

## 2020-07-03 DIAGNOSIS — C50912 Malignant neoplasm of unspecified site of left female breast: Secondary | ICD-10-CM | POA: Diagnosis not present

## 2020-07-03 DIAGNOSIS — M62551 Muscle wasting and atrophy, not elsewhere classified, right thigh: Secondary | ICD-10-CM | POA: Diagnosis not present

## 2020-07-03 DIAGNOSIS — M62552 Muscle wasting and atrophy, not elsewhere classified, left thigh: Secondary | ICD-10-CM | POA: Diagnosis not present

## 2020-07-04 DIAGNOSIS — H524 Presbyopia: Secondary | ICD-10-CM | POA: Diagnosis not present

## 2020-07-04 DIAGNOSIS — H2512 Age-related nuclear cataract, left eye: Secondary | ICD-10-CM | POA: Diagnosis not present

## 2020-07-04 DIAGNOSIS — H401131 Primary open-angle glaucoma, bilateral, mild stage: Secondary | ICD-10-CM | POA: Diagnosis not present

## 2020-07-05 DIAGNOSIS — R2689 Other abnormalities of gait and mobility: Secondary | ICD-10-CM | POA: Diagnosis not present

## 2020-07-05 DIAGNOSIS — M62551 Muscle wasting and atrophy, not elsewhere classified, right thigh: Secondary | ICD-10-CM | POA: Diagnosis not present

## 2020-07-05 DIAGNOSIS — C50912 Malignant neoplasm of unspecified site of left female breast: Secondary | ICD-10-CM | POA: Diagnosis not present

## 2020-07-05 DIAGNOSIS — M62552 Muscle wasting and atrophy, not elsewhere classified, left thigh: Secondary | ICD-10-CM | POA: Diagnosis not present

## 2020-07-11 DIAGNOSIS — R2689 Other abnormalities of gait and mobility: Secondary | ICD-10-CM | POA: Diagnosis not present

## 2020-07-11 DIAGNOSIS — M62551 Muscle wasting and atrophy, not elsewhere classified, right thigh: Secondary | ICD-10-CM | POA: Diagnosis not present

## 2020-07-11 DIAGNOSIS — C50912 Malignant neoplasm of unspecified site of left female breast: Secondary | ICD-10-CM | POA: Diagnosis not present

## 2020-07-11 DIAGNOSIS — M62552 Muscle wasting and atrophy, not elsewhere classified, left thigh: Secondary | ICD-10-CM | POA: Diagnosis not present

## 2020-07-18 DIAGNOSIS — I739 Peripheral vascular disease, unspecified: Secondary | ICD-10-CM | POA: Diagnosis not present

## 2020-07-18 DIAGNOSIS — E785 Hyperlipidemia, unspecified: Secondary | ICD-10-CM | POA: Diagnosis not present

## 2020-07-18 DIAGNOSIS — Z1339 Encounter for screening examination for other mental health and behavioral disorders: Secondary | ICD-10-CM | POA: Diagnosis not present

## 2020-07-18 DIAGNOSIS — Z79899 Other long term (current) drug therapy: Secondary | ICD-10-CM | POA: Diagnosis not present

## 2020-07-18 DIAGNOSIS — Z1331 Encounter for screening for depression: Secondary | ICD-10-CM | POA: Diagnosis not present

## 2020-07-18 DIAGNOSIS — Z0000 Encounter for general adult medical examination without abnormal findings: Secondary | ICD-10-CM | POA: Diagnosis not present

## 2020-07-18 DIAGNOSIS — E039 Hypothyroidism, unspecified: Secondary | ICD-10-CM | POA: Diagnosis not present

## 2020-07-18 DIAGNOSIS — K219 Gastro-esophageal reflux disease without esophagitis: Secondary | ICD-10-CM | POA: Diagnosis not present

## 2020-07-18 DIAGNOSIS — I10 Essential (primary) hypertension: Secondary | ICD-10-CM | POA: Diagnosis not present

## 2020-07-18 DIAGNOSIS — Z6839 Body mass index (BMI) 39.0-39.9, adult: Secondary | ICD-10-CM | POA: Diagnosis not present

## 2020-07-20 ENCOUNTER — Ambulatory Visit (INDEPENDENT_AMBULATORY_CARE_PROVIDER_SITE_OTHER): Payer: Medicare Other | Admitting: Podiatry

## 2020-07-20 ENCOUNTER — Other Ambulatory Visit: Payer: Self-pay

## 2020-07-20 ENCOUNTER — Encounter: Payer: Self-pay | Admitting: Podiatry

## 2020-07-20 DIAGNOSIS — B351 Tinea unguium: Secondary | ICD-10-CM | POA: Diagnosis not present

## 2020-07-20 DIAGNOSIS — M79675 Pain in left toe(s): Secondary | ICD-10-CM | POA: Diagnosis not present

## 2020-07-20 DIAGNOSIS — M2041 Other hammer toe(s) (acquired), right foot: Secondary | ICD-10-CM

## 2020-07-20 DIAGNOSIS — M79674 Pain in right toe(s): Secondary | ICD-10-CM | POA: Diagnosis not present

## 2020-07-20 DIAGNOSIS — M2042 Other hammer toe(s) (acquired), left foot: Secondary | ICD-10-CM

## 2020-07-20 DIAGNOSIS — I739 Peripheral vascular disease, unspecified: Secondary | ICD-10-CM

## 2020-07-23 NOTE — Progress Notes (Addendum)
  Subjective:  Patient ID: Katelyn Watson, female    DOB: 1946-03-13,  MRN: [id number]  Katelyn Watson presents to clinic today for painful thick toenails that are difficult to trim. Pain interferes with ambulation. Aggravating factors include wearing enclosed shoe gear. Pain is relieved with periodic professional debridement.  Allergies  Allergen Reactions  . Singulair [Montelukast Sodium]     Causes weakness   . Latex Rash  . Tape Rash    Review of Systems: Negative except as noted in the HPI. Objective:   Constitutional Katelyn Watson is a pleasant 75 y.o. Caucasian female, in NAD. AAO x 3.   Vascular Capillary fill time to digits <3 seconds b/l lower extremities. Faintly palpable DP pulse(s) b/l lower extremities. Nonpalpable PT pulse(s) b/l lower extremities. Pedal hair absent. Lower extremity skin temperature gradient within normal limits. Evidence of chronic venous insufficiency b/l lower extremities. No cyanosis or clubbing noted.  Neurologic Normal speech. Oriented to person, place, and time. Protective sensation intact 5/5 intact bilaterally with 10g monofilament b/l.  Dermatologic Pedal skin with normal turgor, texture and tone bilaterally. No open wounds bilaterally. No interdigital macerations bilaterally. Toenails 1-5 b/l elongated, discolored, dystrophic, thickened, crumbly with subungual debris and tenderness to dorsal palpation.  Orthopedic: Normal muscle strength 5/5 to all lower extremity muscle groups bilaterally. No pain crepitus or joint limitation noted with ROM b/l. Hammertoes noted to the b/l lower extremities.   Radiographs: None Assessment:   1. Pain due to onychomycosis of toenails of both feet   2. Acquired hammertoes of both feet   3. PVD (peripheral vascular disease) ([place])    Plan:  Patient was evaluated and treated and all questions answered.  Onychomycosis with pain -Nails palliatively debridement as below -Educated on self-care  Procedure: Nail  Debridement Rationale: Pain Type of Debridement: manual, sharp debridement. Instrumentation: Nail nipper, rotary burr. Number of Nails: 10 -Examined patient. -No new findings. No new orders. -Patient to continue soft, supportive shoe gear daily. -Toenails 1-5 b/l were debrided in length and girth with sterile nail nippers and dremel without iatrogenic bleeding.  -Patient to report any pedal injuries to medical professional immediately. -Patient/POA to call should there be question/concern in the interim.  Return in about 3 months (around 10/17/2020).  Marzetta Board, DPM

## 2020-08-08 DIAGNOSIS — J323 Chronic sphenoidal sinusitis: Secondary | ICD-10-CM | POA: Diagnosis not present

## 2020-08-08 DIAGNOSIS — I2510 Atherosclerotic heart disease of native coronary artery without angina pectoris: Secondary | ICD-10-CM | POA: Diagnosis not present

## 2020-08-08 DIAGNOSIS — S0081XA Abrasion of other part of head, initial encounter: Secondary | ICD-10-CM | POA: Diagnosis not present

## 2020-08-08 DIAGNOSIS — Z79899 Other long term (current) drug therapy: Secondary | ICD-10-CM | POA: Diagnosis not present

## 2020-08-08 DIAGNOSIS — M4312 Spondylolisthesis, cervical region: Secondary | ICD-10-CM | POA: Diagnosis not present

## 2020-08-08 DIAGNOSIS — I6782 Cerebral ischemia: Secondary | ICD-10-CM | POA: Diagnosis not present

## 2020-08-08 DIAGNOSIS — J3489 Other specified disorders of nose and nasal sinuses: Secondary | ICD-10-CM | POA: Diagnosis not present

## 2020-08-08 DIAGNOSIS — E039 Hypothyroidism, unspecified: Secondary | ICD-10-CM | POA: Diagnosis not present

## 2020-08-08 DIAGNOSIS — R519 Headache, unspecified: Secondary | ICD-10-CM | POA: Diagnosis not present

## 2020-08-08 DIAGNOSIS — M47812 Spondylosis without myelopathy or radiculopathy, cervical region: Secondary | ICD-10-CM | POA: Diagnosis not present

## 2020-08-08 DIAGNOSIS — M542 Cervicalgia: Secondary | ICD-10-CM | POA: Diagnosis not present

## 2020-08-08 DIAGNOSIS — Z7982 Long term (current) use of aspirin: Secondary | ICD-10-CM | POA: Diagnosis not present

## 2020-08-22 DIAGNOSIS — N289 Disorder of kidney and ureter, unspecified: Secondary | ICD-10-CM | POA: Diagnosis not present

## 2020-08-22 DIAGNOSIS — R296 Repeated falls: Secondary | ICD-10-CM | POA: Diagnosis not present

## 2020-08-22 DIAGNOSIS — Z6839 Body mass index (BMI) 39.0-39.9, adult: Secondary | ICD-10-CM | POA: Diagnosis not present

## 2020-08-22 DIAGNOSIS — I10 Essential (primary) hypertension: Secondary | ICD-10-CM | POA: Diagnosis not present

## 2020-08-22 DIAGNOSIS — M5136 Other intervertebral disc degeneration, lumbar region: Secondary | ICD-10-CM | POA: Diagnosis not present

## 2020-09-04 DIAGNOSIS — I872 Venous insufficiency (chronic) (peripheral): Secondary | ICD-10-CM | POA: Diagnosis not present

## 2020-09-04 DIAGNOSIS — R0989 Other specified symptoms and signs involving the circulatory and respiratory systems: Secondary | ICD-10-CM | POA: Diagnosis not present

## 2020-09-07 DIAGNOSIS — M5126 Other intervertebral disc displacement, lumbar region: Secondary | ICD-10-CM | POA: Diagnosis not present

## 2020-09-07 DIAGNOSIS — M5136 Other intervertebral disc degeneration, lumbar region: Secondary | ICD-10-CM | POA: Diagnosis not present

## 2020-09-07 DIAGNOSIS — R296 Repeated falls: Secondary | ICD-10-CM | POA: Diagnosis not present

## 2020-09-07 DIAGNOSIS — M48061 Spinal stenosis, lumbar region without neurogenic claudication: Secondary | ICD-10-CM | POA: Diagnosis not present

## 2020-09-07 DIAGNOSIS — N281 Cyst of kidney, acquired: Secondary | ICD-10-CM | POA: Diagnosis not present

## 2020-09-12 DIAGNOSIS — N200 Calculus of kidney: Secondary | ICD-10-CM | POA: Diagnosis not present

## 2020-09-12 DIAGNOSIS — N281 Cyst of kidney, acquired: Secondary | ICD-10-CM | POA: Diagnosis not present

## 2020-09-29 DIAGNOSIS — E785 Hyperlipidemia, unspecified: Secondary | ICD-10-CM | POA: Diagnosis not present

## 2020-09-29 DIAGNOSIS — K219 Gastro-esophageal reflux disease without esophagitis: Secondary | ICD-10-CM | POA: Diagnosis not present

## 2020-09-29 DIAGNOSIS — Z6841 Body Mass Index (BMI) 40.0 and over, adult: Secondary | ICD-10-CM | POA: Diagnosis not present

## 2020-09-29 DIAGNOSIS — I872 Venous insufficiency (chronic) (peripheral): Secondary | ICD-10-CM | POA: Diagnosis not present

## 2020-09-29 DIAGNOSIS — M150 Primary generalized (osteo)arthritis: Secondary | ICD-10-CM | POA: Diagnosis not present

## 2020-09-29 DIAGNOSIS — I10 Essential (primary) hypertension: Secondary | ICD-10-CM | POA: Diagnosis not present

## 2020-10-16 DIAGNOSIS — C50912 Malignant neoplasm of unspecified site of left female breast: Secondary | ICD-10-CM | POA: Diagnosis not present

## 2020-10-16 DIAGNOSIS — Z9181 History of falling: Secondary | ICD-10-CM | POA: Diagnosis not present

## 2020-10-16 DIAGNOSIS — Z9221 Personal history of antineoplastic chemotherapy: Secondary | ICD-10-CM | POA: Diagnosis not present

## 2020-10-16 DIAGNOSIS — Z170 Estrogen receptor positive status [ER+]: Secondary | ICD-10-CM | POA: Diagnosis not present

## 2020-10-16 DIAGNOSIS — Z780 Asymptomatic menopausal state: Secondary | ICD-10-CM | POA: Diagnosis not present

## 2020-10-16 DIAGNOSIS — M8580 Other specified disorders of bone density and structure, unspecified site: Secondary | ICD-10-CM | POA: Diagnosis not present

## 2020-11-03 DIAGNOSIS — Z79899 Other long term (current) drug therapy: Secondary | ICD-10-CM | POA: Diagnosis not present

## 2020-11-03 DIAGNOSIS — H401131 Primary open-angle glaucoma, bilateral, mild stage: Secondary | ICD-10-CM | POA: Diagnosis not present

## 2020-11-08 DIAGNOSIS — M150 Primary generalized (osteo)arthritis: Secondary | ICD-10-CM | POA: Diagnosis not present

## 2020-11-08 DIAGNOSIS — E669 Obesity, unspecified: Secondary | ICD-10-CM | POA: Diagnosis not present

## 2020-11-08 DIAGNOSIS — Z79899 Other long term (current) drug therapy: Secondary | ICD-10-CM | POA: Diagnosis not present

## 2020-11-08 DIAGNOSIS — R768 Other specified abnormal immunological findings in serum: Secondary | ICD-10-CM | POA: Diagnosis not present

## 2020-11-08 DIAGNOSIS — M2550 Pain in unspecified joint: Secondary | ICD-10-CM | POA: Diagnosis not present

## 2020-11-08 DIAGNOSIS — Z6839 Body mass index (BMI) 39.0-39.9, adult: Secondary | ICD-10-CM | POA: Diagnosis not present

## 2020-11-09 ENCOUNTER — Ambulatory Visit (INDEPENDENT_AMBULATORY_CARE_PROVIDER_SITE_OTHER): Payer: Medicare Other | Admitting: Podiatry

## 2020-11-09 ENCOUNTER — Encounter: Payer: Self-pay | Admitting: Podiatry

## 2020-11-09 ENCOUNTER — Other Ambulatory Visit: Payer: Self-pay

## 2020-11-09 DIAGNOSIS — M79675 Pain in left toe(s): Secondary | ICD-10-CM | POA: Diagnosis not present

## 2020-11-09 DIAGNOSIS — B351 Tinea unguium: Secondary | ICD-10-CM

## 2020-11-09 DIAGNOSIS — M79674 Pain in right toe(s): Secondary | ICD-10-CM

## 2020-11-09 DIAGNOSIS — I739 Peripheral vascular disease, unspecified: Secondary | ICD-10-CM

## 2020-11-15 NOTE — Progress Notes (Signed)
  Subjective:  Patient ID: Katelyn Watson, female    DOB: 1945/10/05,  MRN: [id number]  Katelyn Watson presents to clinic today for for at risk foot care. Patient has h/o PAD and painful thick toenails that are difficult to trim. Pain interferes with ambulation. Aggravating factors include wearing enclosed shoe gear. Pain is relieved with periodic professional debridement.  Patient states her toenails are very long and sore today. She notes no other pedal concerns on today's visit.  Allergies  Allergen Reactions   Singulair [Montelukast Sodium]     Causes weakness    Latex Rash   Tape Rash    Review of Systems: Negative except as noted in the HPI. Objective:   Constitutional Katelyn Watson is a pleasant 75 y.o. Caucasian female, in NAD. AAO x 3.   Vascular Capillary fill time to digits <3 seconds b/l lower extremities. Faintly palpable DP pulse(s) b/l lower extremities. Nonpalpable PT pulse(s) b/l lower extremities. Pedal hair absent. Lower extremity skin temperature gradient within normal limits. No pain with calf compression b/l. No cyanosis or clubbing noted.  Neurologic Normal speech. Oriented to person, place, and time. Protective sensation intact 5/5 intact bilaterally with 10g monofilament b/l. Vibratory sensation intact b/l.  Dermatologic Pedal skin with normal turgor, texture and tone bilaterally. No open wounds bilaterally. No interdigital macerations bilaterally. Toenails 1-5 b/l elongated, discolored, dystrophic, thickened, crumbly with subungual debris and tenderness to dorsal palpation.  Orthopedic: Normal muscle strength 5/5 to all lower extremity muscle groups bilaterally. No pain crepitus or joint limitation noted with ROM b/l. Hammertoe(s) noted to the 2-5 bilaterally.   Radiographs: None Assessment:   1. Pain due to onychomycosis of toenails of both feet   2. PVD (peripheral vascular disease) ([place])    Plan:  Patient was evaluated and treated and all questions  answered.  Onychomycosis with pain -Nails palliatively debridement as below -Educated on self-care  Procedure: Nail Debridement Rationale: Pain Type of Debridement: manual, sharp debridement. Instrumentation: Nail nipper, rotary burr. Number of Nails: 10 -Examined patient. -Patient to continue soft, supportive shoe gear daily. -Toenails 1-5 b/l were debrided in length and girth with sterile nail nippers and dremel without iatrogenic bleeding.  -Patient to report any pedal injuries to medical professional immediately. -Patient/POA to call should there be question/concern in the interim.  Return in about 3 months (around 02/09/2021).  Marzetta Board, DPM

## 2020-12-09 DIAGNOSIS — J209 Acute bronchitis, unspecified: Secondary | ICD-10-CM | POA: Diagnosis not present

## 2020-12-09 DIAGNOSIS — Z20828 Contact with and (suspected) exposure to other viral communicable diseases: Secondary | ICD-10-CM | POA: Diagnosis not present

## 2020-12-09 DIAGNOSIS — R059 Cough, unspecified: Secondary | ICD-10-CM | POA: Diagnosis not present

## 2020-12-19 DIAGNOSIS — R062 Wheezing: Secondary | ICD-10-CM | POA: Diagnosis not present

## 2020-12-19 DIAGNOSIS — J40 Bronchitis, not specified as acute or chronic: Secondary | ICD-10-CM | POA: Diagnosis not present

## 2021-01-02 DIAGNOSIS — K219 Gastro-esophageal reflux disease without esophagitis: Secondary | ICD-10-CM | POA: Diagnosis not present

## 2021-01-02 DIAGNOSIS — E785 Hyperlipidemia, unspecified: Secondary | ICD-10-CM | POA: Diagnosis not present

## 2021-01-02 DIAGNOSIS — E039 Hypothyroidism, unspecified: Secondary | ICD-10-CM | POA: Diagnosis not present

## 2021-01-02 DIAGNOSIS — I10 Essential (primary) hypertension: Secondary | ICD-10-CM | POA: Diagnosis not present

## 2021-01-02 DIAGNOSIS — R5383 Other fatigue: Secondary | ICD-10-CM | POA: Diagnosis not present

## 2021-01-02 DIAGNOSIS — R059 Cough, unspecified: Secondary | ICD-10-CM | POA: Diagnosis not present

## 2021-01-02 DIAGNOSIS — Z79899 Other long term (current) drug therapy: Secondary | ICD-10-CM | POA: Diagnosis not present

## 2021-01-02 DIAGNOSIS — Z6839 Body mass index (BMI) 39.0-39.9, adult: Secondary | ICD-10-CM | POA: Diagnosis not present

## 2021-01-02 DIAGNOSIS — J180 Bronchopneumonia, unspecified organism: Secondary | ICD-10-CM | POA: Diagnosis not present

## 2021-01-02 DIAGNOSIS — R062 Wheezing: Secondary | ICD-10-CM | POA: Diagnosis not present

## 2021-01-23 DIAGNOSIS — R0602 Shortness of breath: Secondary | ICD-10-CM | POA: Diagnosis not present

## 2021-01-23 DIAGNOSIS — I517 Cardiomegaly: Secondary | ICD-10-CM | POA: Diagnosis not present

## 2021-01-23 DIAGNOSIS — R079 Chest pain, unspecified: Secondary | ICD-10-CM | POA: Diagnosis not present

## 2021-01-23 DIAGNOSIS — Z853 Personal history of malignant neoplasm of breast: Secondary | ICD-10-CM | POA: Diagnosis not present

## 2021-03-01 ENCOUNTER — Other Ambulatory Visit: Payer: Self-pay

## 2021-03-01 ENCOUNTER — Encounter: Payer: Self-pay | Admitting: Podiatry

## 2021-03-01 ENCOUNTER — Ambulatory Visit (INDEPENDENT_AMBULATORY_CARE_PROVIDER_SITE_OTHER): Payer: Medicare Other | Admitting: Podiatry

## 2021-03-01 DIAGNOSIS — I739 Peripheral vascular disease, unspecified: Secondary | ICD-10-CM

## 2021-03-01 DIAGNOSIS — B351 Tinea unguium: Secondary | ICD-10-CM | POA: Diagnosis not present

## 2021-03-01 DIAGNOSIS — M79674 Pain in right toe(s): Secondary | ICD-10-CM

## 2021-03-01 DIAGNOSIS — M79675 Pain in left toe(s): Secondary | ICD-10-CM | POA: Diagnosis not present

## 2021-03-02 NOTE — Progress Notes (Signed)
  Subjective:  Patient ID: Katelyn Watson, female    DOB: 05-Apr-1946,  MRN: [id number]  Katelyn Watson presents to clinic today for for at risk foot care. Patient has h/o PAD and painful thick toenails that are difficult to trim. Pain interferes with ambulation. Aggravating factors include wearing enclosed shoe gear. Pain is relieved with periodic professional debridement.  Patient states she saw a Vascular specialist and she was advised to purchase compression hose for venous insufficiency. She states she will see a doctor at [facility] to get stockings that zip up.  Allergies  Allergen Reactions   Montelukast Sodium     Causes weakness  Other reaction(s): weakness, fatigue   Latex Rash   Tape Rash    Review of Systems: Negative except as noted in the HPI. Objective:   Constitutional Katelyn Watson is a pleasant 75 y.o. Caucasian female, in NAD. AAO x 3.   Vascular Capillary fill time to digits <3 seconds b/l lower extremities. Faintly palpable DP pulse(s) b/l lower extremities. Nonpalpable PT pulse(s) b/l lower extremities. Pedal hair absent. Lower extremity skin temperature gradient within normal limits. No pain with calf compression b/l. No cyanosis or clubbing noted.  Neurologic Normal speech. Oriented to person, place, and time. Protective sensation intact 5/5 intact bilaterally with 10g monofilament b/l. Vibratory sensation intact b/l.  Dermatologic Pedal skin with normal turgor, texture and tone bilaterally. No open wounds bilaterally. No interdigital macerations bilaterally. Toenails 1-5 b/l elongated, discolored, dystrophic, thickened, crumbly with subungual debris and tenderness to dorsal palpation.  Orthopedic: Normal muscle strength 5/5 to all lower extremity muscle groups bilaterally. No pain crepitus or joint limitation noted with ROM b/l. Hammertoe(s) noted to the 2-5 bilaterally.   Radiographs: None Assessment:   1. Pain due to onychomycosis of toenails of both feet   2.  PVD (peripheral vascular disease) ([place])    Plan:  -No new findings. No new orders. -Patient to continue soft, supportive shoe gear daily. -Toenails 1-5 b/l were debrided in length and girth with sterile nail nippers and dremel without iatrogenic bleeding.  -Patient to report any pedal injuries to medical professional immediately. -Patient/POA to call should there be question/concern in the interim.  Return in about 3 months (around 05/31/2021).  Marzetta Board, DPM

## 2021-04-16 DIAGNOSIS — Z23 Encounter for immunization: Secondary | ICD-10-CM | POA: Diagnosis not present

## 2021-04-16 DIAGNOSIS — C50912 Malignant neoplasm of unspecified site of left female breast: Secondary | ICD-10-CM | POA: Diagnosis not present

## 2021-05-14 DIAGNOSIS — H401131 Primary open-angle glaucoma, bilateral, mild stage: Secondary | ICD-10-CM | POA: Diagnosis not present

## 2021-05-23 DIAGNOSIS — R2242 Localized swelling, mass and lump, left lower limb: Secondary | ICD-10-CM | POA: Diagnosis not present

## 2021-05-23 DIAGNOSIS — R2241 Localized swelling, mass and lump, right lower limb: Secondary | ICD-10-CM | POA: Diagnosis not present

## 2021-05-28 DIAGNOSIS — I87311 Chronic venous hypertension (idiopathic) with ulcer of right lower extremity: Secondary | ICD-10-CM | POA: Diagnosis not present

## 2021-06-07 DIAGNOSIS — L97211 Non-pressure chronic ulcer of right calf limited to breakdown of skin: Secondary | ICD-10-CM | POA: Diagnosis not present

## 2021-06-07 DIAGNOSIS — I83012 Varicose veins of right lower extremity with ulcer of calf: Secondary | ICD-10-CM | POA: Diagnosis not present

## 2021-06-07 DIAGNOSIS — I872 Venous insufficiency (chronic) (peripheral): Secondary | ICD-10-CM | POA: Diagnosis not present

## 2021-06-12 DIAGNOSIS — M064 Inflammatory polyarthropathy: Secondary | ICD-10-CM | POA: Diagnosis not present

## 2021-06-12 DIAGNOSIS — I872 Venous insufficiency (chronic) (peripheral): Secondary | ICD-10-CM | POA: Diagnosis not present

## 2021-06-12 DIAGNOSIS — Z79899 Other long term (current) drug therapy: Secondary | ICD-10-CM | POA: Diagnosis not present

## 2021-06-14 DIAGNOSIS — M79604 Pain in right leg: Secondary | ICD-10-CM | POA: Diagnosis not present

## 2021-06-14 DIAGNOSIS — L03115 Cellulitis of right lower limb: Secondary | ICD-10-CM | POA: Diagnosis not present

## 2021-06-14 DIAGNOSIS — Z6839 Body mass index (BMI) 39.0-39.9, adult: Secondary | ICD-10-CM | POA: Diagnosis not present

## 2021-06-22 DIAGNOSIS — M79604 Pain in right leg: Secondary | ICD-10-CM | POA: Diagnosis not present

## 2021-06-22 DIAGNOSIS — I10 Essential (primary) hypertension: Secondary | ICD-10-CM | POA: Diagnosis not present

## 2021-06-22 DIAGNOSIS — Z79899 Other long term (current) drug therapy: Secondary | ICD-10-CM | POA: Diagnosis not present

## 2021-06-22 DIAGNOSIS — R0602 Shortness of breath: Secondary | ICD-10-CM | POA: Diagnosis not present

## 2021-06-22 DIAGNOSIS — E039 Hypothyroidism, unspecified: Secondary | ICD-10-CM | POA: Diagnosis not present

## 2021-06-22 DIAGNOSIS — Z6841 Body Mass Index (BMI) 40.0 and over, adult: Secondary | ICD-10-CM | POA: Diagnosis not present

## 2021-06-22 DIAGNOSIS — E785 Hyperlipidemia, unspecified: Secondary | ICD-10-CM | POA: Diagnosis not present

## 2021-06-22 DIAGNOSIS — I739 Peripheral vascular disease, unspecified: Secondary | ICD-10-CM | POA: Diagnosis not present

## 2021-07-05 ENCOUNTER — Ambulatory Visit: Payer: Medicare Other | Admitting: Podiatry

## 2021-07-09 ENCOUNTER — Other Ambulatory Visit: Payer: Self-pay

## 2021-07-09 DIAGNOSIS — I872 Venous insufficiency (chronic) (peripheral): Secondary | ICD-10-CM

## 2021-07-09 NOTE — Progress Notes (Signed)
[facility] [place]  ASSESSMENT / PLAN: Katelyn Watson is a 76 y.o. female with chronic venous insufficiency of bilateral lower extremities causing ulceration which has healed (C5 disease).  Venous duplex is significant for successful ablation on right; refluxing saphenous vein on the left Recommend compression and elevation for symptomatic relief. I offered her discussion about saphenous vein ablation on the left.  We both agreed that given her persistence of symptoms on the right despite successful ablation, this may not produce much relief for her.  She will continue with conservative measures for now.  Follow-up with me as needed.   CHIEF COMPLAINT: Painful venous stasis dermatitis bilaterally  HISTORY OF PRESENT ILLNESS: Katelyn Watson is a 76 y.o. female who presents to clinic for evaluation of bilateral chronic venous insufficiency.  She underwent saphenous vein ablation with Dr. Kellie Simmering many years ago.  She saw Dr. Gwenlyn Saran in 2019 for the same.  The patient reports chronic pain in her pretibial and medial calves bilaterally.  This is around an area of chronic inflammation.  She has had venous ulcers in the past which healed spontaneously.  She is somewhat compliant with compression therapy.  She recently was infected with COVID, and was not able to wear her compression because of severe fatigue.  Past Medical History:  Diagnosis Date   Anemia    hx of    Arthritis    Asthma    ??   Breast cancer ([place])    treated with masectomy, chemo, and xrt   Depression    Family history of anesthesia complication    brother- became stiff after sinus surgery    GERD (gastroesophageal reflux disease)    HLD (hyperlipidemia)    HTN (hypertension)    Hypothyroidism    Shortness of breath    slight with exertion     Past Surgical History:  Procedure Laterality Date   ABDOMINAL HYSTERECTOMY     breast masectomy  1999   CARDIAC CATHETERIZATION     DILATION AND CURETTAGE OF  UTERUS  2002   TONSILLECTOMY     and adenoidectomy    TOTAL KNEE ARTHROPLASTY Left 01/17/2014   Procedure: LEFT TOTAL KNEE ARTHROPLASTY;  Surgeon: Mauri Pole, MD;  Location: [facility];  Service: Orthopedics;  Laterality: Left;   VARICOSE VEIN SURGERY     VESICOVAGINAL FISTULA CLOSURE W/ TAH  2004    Family History  Problem Relation Age of Onset   Heart disease Other        father and brother (afib); mother (MI)   Cancer Other        mother (colon); brother (melanoma, lung)    Social History   Socioeconomic History   Marital status: Widowed    Spouse name: Not on file   Number of children: Not on file   Years of education: Not on file   Highest education level: Not on file  Occupational History   Not on file  Tobacco Use   Smoking status: Never   Smokeless tobacco: Never  Vaping Use   Vaping Use: Never used  Substance and Sexual Activity   Alcohol use: No   Drug use: No   Sexual activity: Not on file  Other Topics Concern   Not on file  Social History Narrative   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    Pt has traveled to [facility] and the beach in the past few months,    Social Determinants of Health  Financial Resource Strain: Not on file  Food Insecurity: Not on file  Transportation Needs: Not on file  Physical Activity: Not on file  Stress: Not on file  Social Connections: Not on file  Intimate Partner Violence: Not on file    Allergies  Allergen Reactions   Montelukast Sodium     Causes weakness  Other reaction(s): weakness, fatigue   Latex Rash   Tape Rash    Current Outpatient Medications  Medication Sig Dispense Refill   albuterol (PROVENTIL) (2.5 MG/3ML) 0.083% nebulizer solution Take 2.5 mg by nebulization every 6 (six) hours as needed for wheezing or shortness of breath.     albuterol (VENTOLIN HFA) 108 (90 Base) MCG/ACT inhaler Inhale 1-2 puffs into the lungs every 6 (six) hours as needed for wheezing or shortness of breath.      ammonium lactate  (LAC-HYDRIN) 12 % lotion Apply topically.     aspirin 81 MG EC tablet 1 tablet     atorvastatin (LIPITOR) 10 MG tablet 1 tablet     Calcium Carb-Cholecalciferol (CALTRATE 600+D3) 600-800 MG-UNIT TABS 1 tablet with a meal     carvedilol (COREG) 12.5 MG tablet Take 12.5 mg by mouth 2 (two) times daily with a meal.      cefdinir (OMNICEF) 300 MG capsule Take 300 mg by mouth 2 (two) times daily.     Cholecalciferol (VITAMIN D) 50 MCG (2000 UT) tablet 1 tablet     diclofenac (VOLTAREN) 75 MG EC tablet 1 tablet     fexofenadine (ALLEGRA) 180 MG tablet Take 180 mg by mouth daily.       fluconazole (DIFLUCAN) 150 MG tablet Take 150 mg by mouth daily.     hydrochlorothiazide (HYDRODIURIL) 25 MG tablet Take 25 mg by mouth every morning.     hydroxychloroquine (PLAQUENIL) 200 MG tablet 1 tab     hyoscyamine (ANASPAZ) 0.125 MG TBDP disintergrating tablet Take 0.125 mg by mouth every 4 (four) hours as needed.     latanoprost (XALATAN) 0.005 % ophthalmic solution SMARTSIG:In Eye(s)     letrozole (FEMARA) 2.5 MG tablet 1 tablet     levothyroxine (SYNTHROID) 125 MCG tablet 1 tablet in the morning on an empty stomach     losartan (COZAAR) 100 MG tablet 1 tablet     Multiple Vitamins-Minerals (CENTRUM SILVER PO) Take 1 tablet by mouth daily.       omeprazole (PRILOSEC) 20 MG capsule 1 capsule 30 minutes before morning meal     potassium chloride (KLOR-CON) 10 MEQ tablet 1 tablet with food     predniSONE (DELTASONE) 10 MG tablet Take 30 mg by mouth daily.     promethazine-dextromethorphan (PROMETHAZINE-DM) 6.25-15 MG/5ML syrup Take 5 mLs by mouth every 6 (six) hours as needed.     sertraline (ZOLOFT) 100 MG tablet 1 tablet     torsemide (DEMADEX) 10 MG tablet Take 10 mg by mouth daily.     traMADol (ULTRAM) 50 MG tablet 1 tablet as needed     traZODone (DESYREL) 150 MG tablet Take 150 mg by mouth at bedtime.      benzonatate (TESSALON) 200 MG capsule Take 200 mg by mouth 3 (three) times daily. (Patient not  taking: Reported on 07/10/2021)     No current facility-administered medications for this visit.    REVIEW OF SYSTEMS:  [X]  denotes positive finding, [ ]  denotes negative finding Cardiac  Comments:  Chest pain or chest pressure:    Shortness of breath upon exertion:  Short of breath when lying flat:    Irregular heart rhythm:        Vascular    Pain in calf, thigh, or hip brought on by ambulation:    Pain in feet at night that wakes you up from your sleep:     Blood clot in your veins:    Leg swelling:         Pulmonary    Oxygen at home:    Productive cough:     Wheezing:         Neurologic    Sudden weakness in arms or legs:     Sudden numbness in arms or legs:     Sudden onset of difficulty speaking or slurred speech:    Temporary loss of vision in one eye:     Problems with dizziness:         Gastrointestinal    Blood in stool:     Vomited blood:         Genitourinary    Burning when urinating:     Blood in urine:        Psychiatric    Major depression:         Hematologic    Bleeding problems:    Problems with blood clotting too easily:        Skin    Rashes or ulcers:        Constitutional    Fever or chills:      PHYSICAL EXAM Vitals:   07/10/21 0954  BP: (!) 143/80  Pulse: 68  Resp: 20  Temp: 97.9 F (36.6 C)  SpO2: 97%  Weight: 226 lb (102.5 kg)  Height: 5\' 6"  (1.676 m)    Constitutional: well appearing in no distress.  Obese Neurologic: CN intact.  No focal findings.  No sensory loss. Psychiatric: Mood and affect symmetric and appropriate. Eyes: No icterus. No conjunctival pallor. Ears, nose, throat: mucous membranes moist. Midline trachea.  Cardiac: Regular rate and rhythm.  Respiratory: unlabored. Abdominal: soft, non-tender, non-distended.  Peripheral vascular:  2+ dorsalis pedis pulses bilaterally.  Chronic venous dermatitis bilateral lower extremities about the skin overlying the medial malleolus and superiorly.  Evidence of  prior healed venous stasis ulcers. Extremity: No edema. No cyanosis. No pallor.  Skin: No gangrene. No active ulceration.  Lymphatic: No Stemmer's sign. No palpable lymphadenopathy.   PERTINENT LABORATORY AND RADIOLOGIC DATA  Most recent CBC CBC Latest Ref Rng & Units 01/19/2014 01/18/2014 01/11/2014  WBC 4.0 - 10.5 K/uL 12.9(H) 13.1(H) 7.5  Hemoglobin 12.0 - 15.0 g/dL 9.9(L) 11.1(L) 12.5  Hematocrit 36.0 - 46.0 % 28.8(L) 32.6(L) 37.9  Platelets 150 - 400 K/uL 195 205 217     Most recent CMP CMP Latest Ref Rng & Units 01/19/2014 01/18/2014 01/11/2014  Glucose 70 - 99 mg/dL 142(H) 141(H) 119(H)  BUN 6 - 23 mg/dL 23 16 16   Creatinine 0.50 - 1.10 mg/dL 0.97 0.95 1.02  Sodium 137 - 147 mEq/L 144 143 140  Potassium 3.7 - 5.3 mEq/L 3.9 3.3(L) 3.7  Chloride 96 - 112 mEq/L 107 104 100  CO2 19 - 32 mEq/L 27 27 29   Calcium 8.4 - 10.5 mg/dL 8.8 8.6 9.6   Bilateral lower extremity venous reflux study Right:  - No evidence of deep vein thrombosis seen in the right lower extremity,  from the common femoral through the popliteal veins.  - No evidence of superficial venous thrombosis in the right lower  extremity.     -  Venous reflux is noted in the right sapheno-femoral junction.  - Venous reflux is noted in the right greater saphenous vein in the  proximal calf. Great saphenous vein is not visualized from the proximal  thigh to the knee level (prior ablation).     Left:  - No evidence of deep vein thrombosis seen in the left lower extremity,  from the common femoral through the popliteal veins.  - No evidence of superficial venous thrombosis in the left lower  extremity.     - Venous reflux is noted in the left sapheno-femoral junction.  - Venous reflux is noted in the left greater saphenous vein in the thigh.  - Venous reflux is noted in the left greater saphenous vein in the calf.  - Venous reflux is noted in the left perforator vein (mid calf).      Yevonne Aline. Stanford Breed, MD [facility] Phone Number: [phone] 07/10/2021 12:06 PM  Total time spent on preparing this encounter including chart review, data review, collecting history, examining the patient, coordinating care for this new patient, 45 minutes.  Portions of this report may have been transcribed using voice recognition software.  Every effort has been made to ensure accuracy; however, inadvertent computerized transcription errors may still be present.

## 2021-07-10 ENCOUNTER — Ambulatory Visit (INDEPENDENT_AMBULATORY_CARE_PROVIDER_SITE_OTHER): Payer: Medicare Other | Admitting: Vascular Surgery

## 2021-07-10 ENCOUNTER — Encounter: Payer: Self-pay | Admitting: Vascular Surgery

## 2021-07-10 ENCOUNTER — Other Ambulatory Visit: Payer: Self-pay

## 2021-07-10 ENCOUNTER — Ambulatory Visit (HOSPITAL_COMMUNITY)
Admission: RE | Admit: 2021-07-10 | Discharge: 2021-07-10 | Disposition: A | Discharge status: Home / Self Care | Payer: Medicare Other | Source: Ambulatory Visit | Attending: Vascular Surgery | Admitting: Vascular Surgery

## 2021-07-10 VITALS — BP 143/80 | HR 68 | Temp 97.9°F | Resp 20 | Ht 66.0 in | Wt 226.0 lb

## 2021-07-10 DIAGNOSIS — I872 Venous insufficiency (chronic) (peripheral): Secondary | ICD-10-CM | POA: Diagnosis not present

## 2021-07-12 ENCOUNTER — Ambulatory Visit (INDEPENDENT_AMBULATORY_CARE_PROVIDER_SITE_OTHER): Payer: Medicare Other | Admitting: Podiatry

## 2021-07-12 ENCOUNTER — Encounter: Payer: Self-pay | Admitting: Podiatry

## 2021-07-12 DIAGNOSIS — B351 Tinea unguium: Secondary | ICD-10-CM

## 2021-07-12 DIAGNOSIS — M79675 Pain in left toe(s): Secondary | ICD-10-CM

## 2021-07-12 DIAGNOSIS — M79674 Pain in right toe(s): Secondary | ICD-10-CM | POA: Diagnosis not present

## 2021-07-12 DIAGNOSIS — I739 Peripheral vascular disease, unspecified: Secondary | ICD-10-CM

## 2021-07-19 ENCOUNTER — Encounter: Payer: Self-pay | Admitting: Podiatry

## 2021-07-19 DIAGNOSIS — M2550 Pain in unspecified joint: Secondary | ICD-10-CM | POA: Insufficient documentation

## 2021-07-19 DIAGNOSIS — R768 Other specified abnormal immunological findings in serum: Secondary | ICD-10-CM | POA: Insufficient documentation

## 2021-07-19 DIAGNOSIS — E7800 Pure hypercholesterolemia, unspecified: Secondary | ICD-10-CM

## 2021-07-19 DIAGNOSIS — M064 Inflammatory polyarthropathy: Secondary | ICD-10-CM | POA: Insufficient documentation

## 2021-07-19 DIAGNOSIS — M1991 Primary osteoarthritis, unspecified site: Secondary | ICD-10-CM | POA: Insufficient documentation

## 2021-07-19 HISTORY — DX: Pure hypercholesterolemia, unspecified: E78.00

## 2021-07-19 NOTE — Progress Notes (Signed)
Subjective: Katelyn Watson is a 76 y.o. female patient seen today for follow up of  for at risk foot care. Patient has h/o PAD and painful elongated mycotic toenails 1-5 bilaterally which are tender when wearing enclosed shoe gear. Pain is relieved with periodic professional debridement..   Patient has h/o chronic venous insufficiency bilateral lower extremities. She has been followed by Dr.Hawken in Vascular Surgery. Most recently seen at [facility] office in [place].  New problem(s)/concern(s) today: None    PCP is Ernestene Kiel, MD. Last visit was: 09/29/2020.  Allergies  Allergen Reactions   Montelukast Sodium     Causes weakness  Other reaction(s): weakness, fatigue   Latex Rash   Tape Rash    Objective: Physical Exam  General: Patient is a pleasant 76 y.o. Caucasian female in NAD. AAO x 3.   Neurovascular Examination: CFT <3 seconds b/l LE. Faintly palpable DP pulses b/l LE. Diminished PT pulse(s) b/l LE. No pain with calf compression b/l. Lower extremity skin temperature gradient within normal limits. Varicosities present b/l. Evidence of chronic venous insufficiency b/l LE. No ischemia or gangrene noted b/l LE. No cyanosis or clubbing noted b/l LE.  Protective sensation intact 5/5 intact bilaterally with 10g monofilament b/l. Vibratory sensation intact b/l.  Dermatological:  Pedal skin is warm and supple b/l LE. No open wounds b/l LE. No interdigital macerations noted b/l LE. Toenails 1-5 bilaterally elongated, discolored, dystrophic, thickened, and crumbly with subungual debris and tenderness to dorsal palpation.  Musculoskeletal:  Muscle strength 5/5 to all LE muscle groups of b/l lower extremities. Hammertoe deformity noted 2-5 b/l.  Assessment: 1. Pain due to onychomycosis of toenails of both feet   2. PVD (peripheral vascular disease) ([place])    Plan: Patient was evaluated and treated and all questions answered. Consent given for treatment as described  below: -Examined patient. -Mycotic toenails 1-5 bilaterally were debrided in length and girth with sterile nail nippers and dremel without incident. -Patient/POA to call should there be question/concern in the interim.  Return in about 3 months (around 10/09/2021).  Marzetta Board, DPM

## 2021-07-23 DIAGNOSIS — I872 Venous insufficiency (chronic) (peripheral): Secondary | ICD-10-CM | POA: Diagnosis not present

## 2021-07-23 DIAGNOSIS — Z6837 Body mass index (BMI) 37.0-37.9, adult: Secondary | ICD-10-CM | POA: Diagnosis not present

## 2021-07-23 DIAGNOSIS — E039 Hypothyroidism, unspecified: Secondary | ICD-10-CM | POA: Diagnosis not present

## 2021-07-23 DIAGNOSIS — R1030 Lower abdominal pain, unspecified: Secondary | ICD-10-CM | POA: Diagnosis not present

## 2021-07-23 DIAGNOSIS — L03818 Cellulitis of other sites: Secondary | ICD-10-CM | POA: Diagnosis not present

## 2021-07-23 DIAGNOSIS — I10 Essential (primary) hypertension: Secondary | ICD-10-CM | POA: Diagnosis not present

## 2021-07-30 DIAGNOSIS — Z170 Estrogen receptor positive status [ER+]: Secondary | ICD-10-CM | POA: Diagnosis not present

## 2021-07-30 DIAGNOSIS — L02213 Cutaneous abscess of chest wall: Secondary | ICD-10-CM | POA: Diagnosis not present

## 2021-07-30 DIAGNOSIS — L03313 Cellulitis of chest wall: Secondary | ICD-10-CM | POA: Diagnosis not present

## 2021-07-30 DIAGNOSIS — C50911 Malignant neoplasm of unspecified site of right female breast: Secondary | ICD-10-CM | POA: Diagnosis not present

## 2021-07-30 DIAGNOSIS — N61 Mastitis without abscess: Secondary | ICD-10-CM | POA: Diagnosis not present

## 2021-07-30 DIAGNOSIS — Z9181 History of falling: Secondary | ICD-10-CM | POA: Diagnosis not present

## 2021-07-30 DIAGNOSIS — S41101A Unspecified open wound of right upper arm, initial encounter: Secondary | ICD-10-CM | POA: Diagnosis not present

## 2021-07-30 DIAGNOSIS — Z9221 Personal history of antineoplastic chemotherapy: Secondary | ICD-10-CM | POA: Diagnosis not present

## 2021-07-30 DIAGNOSIS — Z9013 Acquired absence of bilateral breasts and nipples: Secondary | ICD-10-CM | POA: Diagnosis not present

## 2021-07-30 DIAGNOSIS — Z79899 Other long term (current) drug therapy: Secondary | ICD-10-CM | POA: Diagnosis not present

## 2021-07-30 DIAGNOSIS — Z853 Personal history of malignant neoplasm of breast: Secondary | ICD-10-CM | POA: Diagnosis not present

## 2021-07-30 DIAGNOSIS — D0511 Intraductal carcinoma in situ of right breast: Secondary | ICD-10-CM | POA: Diagnosis not present

## 2021-08-02 DIAGNOSIS — I083 Combined rheumatic disorders of mitral, aortic and tricuspid valves: Secondary | ICD-10-CM | POA: Diagnosis not present

## 2021-08-02 DIAGNOSIS — I361 Nonrheumatic tricuspid (valve) insufficiency: Secondary | ICD-10-CM

## 2021-08-02 DIAGNOSIS — I10 Essential (primary) hypertension: Secondary | ICD-10-CM | POA: Diagnosis not present

## 2021-08-02 DIAGNOSIS — I340 Nonrheumatic mitral (valve) insufficiency: Secondary | ICD-10-CM

## 2021-08-09 DIAGNOSIS — S41109A Unspecified open wound of unspecified upper arm, initial encounter: Secondary | ICD-10-CM | POA: Insufficient documentation

## 2021-08-09 DIAGNOSIS — C50911 Malignant neoplasm of unspecified site of right female breast: Secondary | ICD-10-CM | POA: Diagnosis not present

## 2021-08-09 DIAGNOSIS — Z9013 Acquired absence of bilateral breasts and nipples: Secondary | ICD-10-CM | POA: Diagnosis not present

## 2021-08-09 DIAGNOSIS — Z888 Allergy status to other drugs, medicaments and biological substances status: Secondary | ICD-10-CM | POA: Diagnosis not present

## 2021-08-09 DIAGNOSIS — Z853 Personal history of malignant neoplasm of breast: Secondary | ICD-10-CM

## 2021-08-09 DIAGNOSIS — Z9221 Personal history of antineoplastic chemotherapy: Secondary | ICD-10-CM | POA: Diagnosis not present

## 2021-08-09 DIAGNOSIS — L929 Granulomatous disorder of the skin and subcutaneous tissue, unspecified: Secondary | ICD-10-CM | POA: Diagnosis not present

## 2021-08-09 DIAGNOSIS — S41101A Unspecified open wound of right upper arm, initial encounter: Secondary | ICD-10-CM | POA: Diagnosis not present

## 2021-08-09 DIAGNOSIS — Z483 Aftercare following surgery for neoplasm: Secondary | ICD-10-CM | POA: Diagnosis not present

## 2021-08-09 DIAGNOSIS — Z923 Personal history of irradiation: Secondary | ICD-10-CM | POA: Diagnosis not present

## 2021-08-09 DIAGNOSIS — L905 Scar conditions and fibrosis of skin: Secondary | ICD-10-CM | POA: Diagnosis not present

## 2021-08-09 HISTORY — DX: Personal history of malignant neoplasm of breast: Z85.3

## 2021-08-09 HISTORY — DX: Unspecified open wound of unspecified upper arm, initial encounter: S41.109A

## 2021-08-16 DIAGNOSIS — Z170 Estrogen receptor positive status [ER+]: Secondary | ICD-10-CM | POA: Diagnosis not present

## 2021-08-16 DIAGNOSIS — Z9013 Acquired absence of bilateral breasts and nipples: Secondary | ICD-10-CM | POA: Diagnosis not present

## 2021-08-16 DIAGNOSIS — Z9221 Personal history of antineoplastic chemotherapy: Secondary | ICD-10-CM | POA: Diagnosis not present

## 2021-08-16 DIAGNOSIS — C50911 Malignant neoplasm of unspecified site of right female breast: Secondary | ICD-10-CM | POA: Diagnosis not present

## 2021-08-16 DIAGNOSIS — Z483 Aftercare following surgery for neoplasm: Secondary | ICD-10-CM | POA: Diagnosis not present

## 2021-08-16 DIAGNOSIS — Z923 Personal history of irradiation: Secondary | ICD-10-CM | POA: Diagnosis not present

## 2021-08-16 DIAGNOSIS — Z79899 Other long term (current) drug therapy: Secondary | ICD-10-CM | POA: Diagnosis not present

## 2021-08-25 DIAGNOSIS — K591 Functional diarrhea: Secondary | ICD-10-CM | POA: Diagnosis not present

## 2021-08-27 DIAGNOSIS — Z6837 Body mass index (BMI) 37.0-37.9, adult: Secondary | ICD-10-CM | POA: Diagnosis not present

## 2021-08-27 DIAGNOSIS — R197 Diarrhea, unspecified: Secondary | ICD-10-CM | POA: Diagnosis not present

## 2021-08-27 DIAGNOSIS — I5189 Other ill-defined heart diseases: Secondary | ICD-10-CM | POA: Diagnosis not present

## 2021-09-13 DIAGNOSIS — H5201 Hypermetropia, right eye: Secondary | ICD-10-CM | POA: Diagnosis not present

## 2021-09-13 DIAGNOSIS — H5212 Myopia, left eye: Secondary | ICD-10-CM | POA: Diagnosis not present

## 2021-09-13 DIAGNOSIS — H2513 Age-related nuclear cataract, bilateral: Secondary | ICD-10-CM | POA: Diagnosis not present

## 2021-09-13 DIAGNOSIS — H401131 Primary open-angle glaucoma, bilateral, mild stage: Secondary | ICD-10-CM | POA: Diagnosis not present

## 2021-09-14 DIAGNOSIS — Z79899 Other long term (current) drug therapy: Secondary | ICD-10-CM | POA: Diagnosis not present

## 2021-09-14 DIAGNOSIS — Z6837 Body mass index (BMI) 37.0-37.9, adult: Secondary | ICD-10-CM | POA: Diagnosis not present

## 2021-09-14 DIAGNOSIS — M1991 Primary osteoarthritis, unspecified site: Secondary | ICD-10-CM | POA: Diagnosis not present

## 2021-09-14 DIAGNOSIS — R768 Other specified abnormal immunological findings in serum: Secondary | ICD-10-CM | POA: Diagnosis not present

## 2021-09-14 DIAGNOSIS — E669 Obesity, unspecified: Secondary | ICD-10-CM | POA: Diagnosis not present

## 2021-09-27 DIAGNOSIS — D649 Anemia, unspecified: Secondary | ICD-10-CM | POA: Diagnosis not present

## 2021-09-27 DIAGNOSIS — I10 Essential (primary) hypertension: Secondary | ICD-10-CM | POA: Diagnosis not present

## 2021-09-27 DIAGNOSIS — Z6837 Body mass index (BMI) 37.0-37.9, adult: Secondary | ICD-10-CM | POA: Diagnosis not present

## 2021-09-27 DIAGNOSIS — Z0000 Encounter for general adult medical examination without abnormal findings: Secondary | ICD-10-CM | POA: Diagnosis not present

## 2021-09-27 DIAGNOSIS — E039 Hypothyroidism, unspecified: Secondary | ICD-10-CM | POA: Diagnosis not present

## 2021-09-27 DIAGNOSIS — R300 Dysuria: Secondary | ICD-10-CM | POA: Diagnosis not present

## 2021-09-27 DIAGNOSIS — N390 Urinary tract infection, site not specified: Secondary | ICD-10-CM | POA: Diagnosis not present

## 2021-09-27 DIAGNOSIS — Z1331 Encounter for screening for depression: Secondary | ICD-10-CM | POA: Diagnosis not present

## 2021-09-27 DIAGNOSIS — I700 Atherosclerosis of aorta: Secondary | ICD-10-CM | POA: Diagnosis not present

## 2021-09-27 DIAGNOSIS — Z79899 Other long term (current) drug therapy: Secondary | ICD-10-CM | POA: Diagnosis not present

## 2021-09-28 ENCOUNTER — Encounter: Payer: Self-pay | Admitting: *Deleted

## 2021-09-28 ENCOUNTER — Encounter: Payer: Self-pay | Admitting: Cardiology

## 2021-09-28 DIAGNOSIS — M1991 Primary osteoarthritis, unspecified site: Secondary | ICD-10-CM | POA: Insufficient documentation

## 2021-10-08 DIAGNOSIS — Z8489 Family history of other specified conditions: Secondary | ICD-10-CM | POA: Insufficient documentation

## 2021-10-08 DIAGNOSIS — D649 Anemia, unspecified: Secondary | ICD-10-CM | POA: Insufficient documentation

## 2021-10-08 DIAGNOSIS — R0602 Shortness of breath: Secondary | ICD-10-CM | POA: Insufficient documentation

## 2021-10-08 DIAGNOSIS — F32A Depression, unspecified: Secondary | ICD-10-CM | POA: Insufficient documentation

## 2021-10-08 DIAGNOSIS — K219 Gastro-esophageal reflux disease without esophagitis: Secondary | ICD-10-CM | POA: Insufficient documentation

## 2021-10-12 DIAGNOSIS — K6389 Other specified diseases of intestine: Secondary | ICD-10-CM | POA: Diagnosis not present

## 2021-10-12 DIAGNOSIS — R933 Abnormal findings on diagnostic imaging of other parts of digestive tract: Secondary | ICD-10-CM | POA: Diagnosis not present

## 2021-10-12 DIAGNOSIS — N200 Calculus of kidney: Secondary | ICD-10-CM | POA: Diagnosis not present

## 2021-10-12 DIAGNOSIS — N281 Cyst of kidney, acquired: Secondary | ICD-10-CM | POA: Diagnosis not present

## 2021-10-12 DIAGNOSIS — K7689 Other specified diseases of liver: Secondary | ICD-10-CM | POA: Diagnosis not present

## 2021-10-12 DIAGNOSIS — R161 Splenomegaly, not elsewhere classified: Secondary | ICD-10-CM | POA: Diagnosis not present

## 2021-10-12 DIAGNOSIS — I700 Atherosclerosis of aorta: Secondary | ICD-10-CM | POA: Diagnosis not present

## 2021-10-13 NOTE — Progress Notes (Signed)
?Cardiology Office Note:   ? ?Date:  10/16/2021  ? ?ID:  SAN RUA, DOB [birth year], MRN [id number] ? ?PCP:  Ernestene Kiel, MD  ?Cardiologist:  Shirlee More, MD  ? ?Referring MD: Ernestene Kiel, MD ? ?ASSESSMENT:   ? ?1. Other cardiomyopathy ([place])   ?2. Hypertensive heart disease with chronic diastolic congestive heart failure ([place])   ?3. Medication management   ?4. Chronic venous insufficiency   ? ?PLAN:   ? ?In order of problems listed above: ? ?She is symptomatic with heart failure and has mild cardiomyopathy in the context of combined treatment of breast cancer with radiation and chemotherapy.  Would like to start a loop diuretic furosemide 20 mg every day stop ARB and intermittent torsemide initiate Entresto 24 to 26 mg daily.  Plan outpatient BMP in about 2 weeks reassess in the office 4 weeks regarding other therapy beta-blocker SGLT2 inhibitor spironolactone. ?Although she has chronic venous insufficiency I think her predominant problem is heart failure with edema neck vein distention and evidence of reduced ejection fraction.  At this time I do not think she requires an ischemia evaluation ? ?Next appointment 4 weeks ? ? ?Medication Adjustments/Labs and Tests Ordered: ?Current medicines are reviewed at length with the patient today.  Concerns regarding medicines are outlined above.  ?Orders Placed This Encounter  ?Procedures  ? Basic metabolic panel  ? Pro b natriuretic peptide (BNP)  ? EKG 12-Lead  ? ?Meds ordered this encounter  ?Medications  ? furosemide (LASIX) 20 MG tablet  ?  Sig: Take 1 tablet (20 mg total) by mouth daily.  ?  Dispense:  30 tablet  ?  Refill:  2  ? sacubitril-valsartan (ENTRESTO) 24-26 MG  ?  Sig: Take 1 tablet by mouth 2 (two) times daily.  ?  Dispense:  60 tablet  ?  Refill:  5  ?  ? ?With increasing shortness of breath edema weakness had an echocardiogram that was abnormal ? ?History of Present Illness:   ? ?Katelyn Watson is a 76 y.o. female with a history of  hypertension, stage 3 CKD anemia hyperlipidemia and breast cancer with neoadjuvant chemotherapy Taxotere left modified radical mastectomy 1999 4 cycles of AC postoperative chemotherapy and adjuvant radiation therapy to left chest wall completed December 1999 who is being seen today for the evaluation of LV dysfunction at the request of Ernestene Kiel, MD. her medical problems include thyroid disease with Hashimoto's thyroiditis and hypothyroidism gastroesophageal reflux disease. ? ?Echo at [facility] 08/02/2021 showed mild LVH EF 45-50% and elevated LA pressure.  ?The LA was moderately dilated. ?MAC and mild to moderate MR  ? ?She is evaluated by pulmonary for shortness of breath had an echocardiogram performed 12/06/2008 left ventricle is normal in size wall thickness EF 55 to 60% there is mild mitral regurgitation mild left atrial enlargement in the pulmonary artery pressure at that time was mildly elevated 42 mmHg. ? ?For the last 6 months she finds himself short of breath with activity and reduced strength and endurance and no longer can do things like housework. ?She is also had marked edema and she has been sleeping in a recliner out of habit ?She has not had orthopnea PND no chest pain palpitation or syncope ?With shortness of breath and edema an echocardiogram was ordered showing findings of cardiomyopathy mild LV dysfunction. ?She has a prescription for torsemide but she does not take it ?He tells me likely in the range of 10 years ago she had a myocardial perfusion  study at [facility] recognizes me and tells me it was normal.  We will see if we can access that report. ?Past Medical History:  ?Diagnosis Date  ? Acquired trigger finger of left ring finger 03/03/2018  ? Anemia   ? hx of   ? Arthritis   ? Asthma 08/02/2016  ? Breast cancer ([place])   ? treated with masectomy, chemo, and xrt  ? Degeneration of lumbar intervertebral disc 09/12/2017  ? Depression   ? DIASTOLIC DYSFUNCTION 87/56/4332  ? Qualifier:  Diagnosis of  By: Gwenette Greet MD, Armando Reichert   ? Family history of anesthesia complication   ? brother- became stiff after sinus surgery   ? GERD (gastroesophageal reflux disease)   ? H/O Hashimoto thyroiditis 09/12/2016  ? History of bilateral breast cancer 08/09/2021  ? HLD (hyperlipidemia)   ? HYPERTENSION 11/28/2008  ? Qualifier: Diagnosis of  By: Doy Mince LPN, Megan    ? Hypothyroidism   ? Laryngospasms 01/14/2018  ? Morbid obesity ([place]) 01/19/2014  ? Non-healing wound of upper extremity 08/09/2021  ? Pure hypercholesterolemia 07/19/2021  ? 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  ? Shortness of breath   ? slight with exertion   ? 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 ? ?Past Surgical History:  ?Procedure Laterality Date  ? ABDOMINAL HYSTERECTOMY  2004  ? CARDIAC CATHETERIZATION    ? DILATION AND CURETTAGE OF UTERUS  06/03/2000  ? MASTECTOMY Left 06/03/1997  ? MASTECTOMY Right 08/2016  ? TONSILLECTOMY  1951  ? and adenoidectomy   ? TOTAL KNEE ARTHROPLASTY Left 01/17/2014  ? Procedure: LEFT TOTAL KNEE ARTHROPLASTY;  Surgeon: Mauri Pole, MD;  Location: [facility];  Service: Orthopedics;  Laterality: Left;  ? TRIGGER FINGER RELEASE Bilateral   ? 2018 Right 4th finger; 2019 Left 4th finger release  ? VARICOSE VEIN SURGERY    ? 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  ? ? ?Current Medications: ?Current Meds  ?Medication Sig  ? albuterol (PROVENTIL) (2.5 MG/3ML) 0.083% nebulizer solution Take 2.5 mg by nebulization every 6 (six) hours as needed for wheezing or shortness of breath.  ? ascorbic acid (VITAMIN C) 1000 MG tablet Take 1 tablet by mouth daily.  ? aspirin 81 MG EC tablet Take 81 mg by mouth daily.  ? atorvastatin (LIPITOR) 10 MG tablet Take 10 mg by mouth daily.  ? Calcium Carb-Cholecalciferol (CALTRATE 600+D3) 600-800 MG-UNIT TABS 1 tablet with a meal  ? carvedilol (COREG) 12.5 MG tablet Take 12.5 mg by mouth 2 (two) times daily with a meal.   ? diclofenac (VOLTAREN) 75 MG EC tablet Take 75 mg by mouth daily.  ? furosemide  (LASIX) 20 MG tablet Take 1 tablet (20 mg total) by mouth daily.  ? hydroxychloroquine (PLAQUENIL) 200 MG tablet Take 200 mg by mouth 2 (two) times daily.  ? hyoscyamine (ANASPAZ) 0.125 MG TBDP disintergrating tablet Take 0.125 mg by mouth every 4 (four) hours as needed for cramping.  ? latanoprost (XALATAN) 0.005 % ophthalmic solution Place 1 drop into both eyes at bedtime.  ? levothyroxine (SYNTHROID) 137 MCG tablet Take 137 mcg by mouth daily before breakfast.  ? Multiple Vitamins-Minerals (CENTRUM SILVER PO) Take 1 tablet by mouth daily.    ? nystatin cream (MYCOSTATIN) Apply 1 application. topically 2 (two) times daily as needed (yeast).  ? omeprazole (PRILOSEC) 20 MG capsule Take 20 mg by mouth 2 (two) times daily before a meal.  ? Polyethyl Glycol-Propyl Glycol (SYSTANE) 0.4-0.3 % SOLN Place 1-2 drops into both eyes  daily as needed (dry eyes).  ? potassium chloride (KLOR-CON) 10 MEQ tablet Take 10 mEq by mouth 2 (two) times daily.  ? sacubitril-valsartan (ENTRESTO) 24-26 MG Take 1 tablet by mouth 2 (two) times daily.  ? sertraline (ZOLOFT) 100 MG tablet Take 100 mg by mouth daily.  ? traZODone (DESYREL) 150 MG tablet Take 150 mg by mouth at bedtime as needed for sleep.  ? [DISCONTINUED] hydrochlorothiazide (HYDRODIURIL) 25 MG tablet Take 25 mg by mouth daily.  ? [DISCONTINUED] losartan (COZAAR) 100 MG tablet Take 100 mg by mouth daily.  ? [DISCONTINUED] torsemide (DEMADEX) 10 MG tablet Take 10 mg by mouth daily as needed (swelling).  ?  ? ?Allergies:   Montelukast sodium, Latex, and Tape  ? ?Social History  ? ?Socioeconomic History  ? Marital status: Widowed  ?  Spouse name: Not on file  ? Number of children: Not on file  ? Years of education: Not on file  ? Highest education level: Not on file  ?Occupational History  ? Not on file  ?Tobacco Use  ? Smoking status: Never  ?  Passive exposure: Past  ? Smokeless tobacco: Never  ?Vaping Use  ? Vaping Use: Never used  ?Substance and Sexual Activity  ? Alcohol use:  No  ? Drug use: No  ? Sexual activity: Not on file  ?Other Topics Concern  ? Not on file  ?Social History Narrative  ? 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  ?  Pt has traveled to [facility] and the beach in the past few m

## 2021-10-15 DIAGNOSIS — R638 Other symptoms and signs concerning food and fluid intake: Secondary | ICD-10-CM | POA: Diagnosis not present

## 2021-10-15 DIAGNOSIS — Z170 Estrogen receptor positive status [ER+]: Secondary | ICD-10-CM | POA: Diagnosis not present

## 2021-10-15 DIAGNOSIS — R432 Parageusia: Secondary | ICD-10-CM | POA: Diagnosis not present

## 2021-10-15 DIAGNOSIS — R439 Unspecified disturbances of smell and taste: Secondary | ICD-10-CM | POA: Diagnosis not present

## 2021-10-15 DIAGNOSIS — D649 Anemia, unspecified: Secondary | ICD-10-CM | POA: Diagnosis not present

## 2021-10-15 DIAGNOSIS — Z9013 Acquired absence of bilateral breasts and nipples: Secondary | ICD-10-CM | POA: Diagnosis not present

## 2021-10-15 DIAGNOSIS — Z9011 Acquired absence of right breast and nipple: Secondary | ICD-10-CM | POA: Diagnosis not present

## 2021-10-15 DIAGNOSIS — Z6835 Body mass index (BMI) 35.0-35.9, adult: Secondary | ICD-10-CM | POA: Diagnosis not present

## 2021-10-15 DIAGNOSIS — C50911 Malignant neoplasm of unspecified site of right female breast: Secondary | ICD-10-CM | POA: Diagnosis not present

## 2021-10-15 DIAGNOSIS — Z79811 Long term (current) use of aromatase inhibitors: Secondary | ICD-10-CM | POA: Diagnosis not present

## 2021-10-15 DIAGNOSIS — L03313 Cellulitis of chest wall: Secondary | ICD-10-CM | POA: Diagnosis not present

## 2021-10-15 DIAGNOSIS — R634 Abnormal weight loss: Secondary | ICD-10-CM | POA: Diagnosis not present

## 2021-10-15 DIAGNOSIS — Z853 Personal history of malignant neoplasm of breast: Secondary | ICD-10-CM | POA: Diagnosis not present

## 2021-10-16 ENCOUNTER — Encounter: Payer: Self-pay | Admitting: Cardiology

## 2021-10-16 ENCOUNTER — Ambulatory Visit (INDEPENDENT_AMBULATORY_CARE_PROVIDER_SITE_OTHER): Payer: Medicare Other | Admitting: Cardiology

## 2021-10-16 VITALS — BP 128/80 | HR 65 | Ht 64.5 in | Wt 215.0 lb

## 2021-10-16 DIAGNOSIS — Z79899 Other long term (current) drug therapy: Secondary | ICD-10-CM

## 2021-10-16 DIAGNOSIS — I519 Heart disease, unspecified: Secondary | ICD-10-CM

## 2021-10-16 DIAGNOSIS — I872 Venous insufficiency (chronic) (peripheral): Secondary | ICD-10-CM

## 2021-10-16 DIAGNOSIS — I110 Hypertensive heart disease with heart failure: Secondary | ICD-10-CM

## 2021-10-16 DIAGNOSIS — R0602 Shortness of breath: Secondary | ICD-10-CM

## 2021-10-16 DIAGNOSIS — I428 Other cardiomyopathies: Secondary | ICD-10-CM

## 2021-10-16 DIAGNOSIS — I5032 Chronic diastolic (congestive) heart failure: Secondary | ICD-10-CM

## 2021-10-16 MED ORDER — ENTRESTO 24-26 MG PO TABS: 1.0000 | ORAL_TABLET | Freq: Two times a day (BID) | ORAL | 5 refills | Status: DC

## 2021-10-16 MED ORDER — FUROSEMIDE 20 MG PO TABS: 20.0000 mg | ORAL_TABLET | Freq: Every day | ORAL | 2 refills | Status: DC

## 2021-10-16 NOTE — Patient Instructions (Signed)
Medication Instructions:  ?Your physician has recommended you make the following change in your medication:  ?Discontinue the Losartan ?Discontinue the HcTz ?Discontinue the Torsemide ?Start Furosemide 20 mg once daily ?Start Entresto 24/26 mg two times daily ? ?*If you need a refill on your cardiac medications before your next appointment, please call your pharmacy* ? ? ?Lab Work: ?Your physician recommends that you return for lab work in: 2 weeks for a BMP and a ProBnp ?Lab opens at [place] DO NOT NEED an appointment. Best time to come is between 8am and 12noon and between 1:30 and 4:30. If you have been asked to fast for your blood work please have nothing to eat or drink after midnight. You may have water.  ? ?If you have labs (blood work) drawn today and your tests are completely normal, you will receive your results only by: ?MyChart Message (if you have MyChart) OR ?A paper copy in the mail ?If you have any lab test that is abnormal or we need to change your treatment, we will call you to review the results. ? ? ?Testing/Procedures: ?NONE ? ? ?Follow-Up: ?At [facility], you and your health needs are our priority.  As part of our continuing mission to provide you with exceptional heart care, we have created designated Provider Care Teams.  These Care Teams include your primary Cardiologist (physician) and Advanced Practice Providers (APPs -  Physician Assistants and Nurse Practitioners) who all work together to provide you with the care you need, when you need it. ? ?We recommend signing up for the patient portal called "MyChart".  Sign up information is provided on this After Visit Summary.  MyChart is used to connect with patients for Virtual Visits (Telemedicine).  Patients are able to view lab/test results, encounter notes, upcoming appointments, etc.  Non-urgent messages can be sent to your provider as well.   ?To learn more about what you can do with MyChart, go to [email or web address].   ? ?Your  next appointment:   ?1 month(s) ? ?The format for your next appointment:   ?In Person ? ?Provider:   ?Shirlee More, MD  ? ? ?Other Instructions ? ? ?Important Information About Sugar ? ? ? ? ?  ?

## 2021-10-18 ENCOUNTER — Encounter: Payer: Self-pay | Admitting: Podiatry

## 2021-10-18 ENCOUNTER — Ambulatory Visit (INDEPENDENT_AMBULATORY_CARE_PROVIDER_SITE_OTHER): Payer: Medicare Other | Admitting: Podiatry

## 2021-10-18 ENCOUNTER — Other Ambulatory Visit: Payer: Self-pay | Admitting: *Deleted

## 2021-10-18 DIAGNOSIS — I739 Peripheral vascular disease, unspecified: Secondary | ICD-10-CM | POA: Diagnosis not present

## 2021-10-18 DIAGNOSIS — M79675 Pain in left toe(s): Secondary | ICD-10-CM | POA: Diagnosis not present

## 2021-10-18 DIAGNOSIS — R21 Rash and other nonspecific skin eruption: Secondary | ICD-10-CM | POA: Diagnosis not present

## 2021-10-18 DIAGNOSIS — B351 Tinea unguium: Secondary | ICD-10-CM

## 2021-10-18 DIAGNOSIS — M79674 Pain in right toe(s): Secondary | ICD-10-CM

## 2021-10-18 NOTE — Patient Instructions (Signed)
Apply Cortisone 10 cream to rash on left foot according to package directions. Call office if condition worsens.  Rash, Adult  A rash is a change in the color of your skin. A rash can also change the way your skin feels. There are many different conditions and factors that can cause a rash. Follow these instructions at home: The goal of treatment is to stop the itching and keep the rash from spreading. Watch for any changes in your symptoms. Let your doctor know about them. Follow these instructions to help with your condition: Medicine Take or apply over-the-counter and prescription medicines only as told by your doctor. These may include medicines: To treat red or swollen skin (corticosteroid creams). To treat itching. To treat an allergy (oral antihistamines). To treat very bad symptoms (oral corticosteroids).  Skin care Put cool cloths (compresses) on the affected areas. Do not scratch or rub your skin. Avoid covering the rash. Make sure that the rash is exposed to air as much as possible. Managing itching and discomfort Avoid hot showers or baths. These can make itching worse. A cold shower may help. Try taking a bath with: Epsom salts. You can get these at your local pharmacy or grocery store. Follow the instructions on the package. Baking soda. Pour a small amount into the bath as told by your doctor. Colloidal oatmeal. You can get this at your local pharmacy or grocery store. Follow the instructions on the package. Try putting baking soda paste onto your skin. Stir water into baking soda until it gets like a paste. Try putting on a lotion that relieves itchiness (calamine lotion). Keep cool and out of the sun. Sweating and being hot can make itching worse. General instructions  Rest as needed. Drink enough fluid to keep your pee (urine) pale yellow. Wear loose-fitting clothing. Avoid scented soaps, detergents, and perfumes. Use gentle soaps, detergents, perfumes, and other  cosmetic products. Avoid anything that causes your rash. Keep a journal to help track what causes your rash. Write down: What you eat. What cosmetic products you use. What you drink. What you wear. This includes jewelry. Keep all follow-up visits as told by your doctor. This is important. Contact a doctor if: You sweat at night. You lose weight. You pee (urinate) more than normal. You pee less than normal, or you notice that your pee is a darker color than normal. You feel weak. You throw up (vomit). Your skin or the whites of your eyes look yellow (jaundice). Your skin: Tingles. Is numb. Your rash: Does not go away after a few days. Gets worse. You are: More thirsty than normal. More tired than normal. You have: New symptoms. Pain in your belly (abdomen). A fever. Watery poop (diarrhea). Get help right away if: You have a fever and your symptoms suddenly get worse. You start to feel mixed up (confused). You have a very bad headache or a stiff neck. You have very bad joint pains or stiffness. You have jerky movements that you cannot control (seizure). Your rash covers all or most of your body. The rash may or may not be painful. You have blisters that: Are on top of the rash. Grow larger. Grow together. Are painful. Are inside your nose or mouth. You have a rash that: Looks like purple pinprick-sized spots all over your body. Has a "bull's eye" or looks like a target. Is red and painful, causes your skin to peel, and is not from being in the sun too long. Summary A  rash is a change in the color of your skin. A rash can also change the way your skin feels. The goal of treatment is to stop the itching and keep the rash from spreading. Take or apply over-the-counter and prescription medicines only as told by your doctor. Contact a doctor if you have new symptoms or symptoms that get worse. Keep all follow-up visits as told by your doctor. This is important. This  information is not intended to replace advice given to you by your health care provider. Make sure you discuss any questions you have with your health care provider. Document Revised: 03/01/2021 Document Reviewed: 03/01/2021 Elsevier Patient Education  [place].

## 2021-10-24 DIAGNOSIS — I110 Hypertensive heart disease with heart failure: Secondary | ICD-10-CM | POA: Diagnosis not present

## 2021-10-24 DIAGNOSIS — N200 Calculus of kidney: Secondary | ICD-10-CM | POA: Diagnosis not present

## 2021-10-24 DIAGNOSIS — I429 Cardiomyopathy, unspecified: Secondary | ICD-10-CM | POA: Diagnosis not present

## 2021-10-24 DIAGNOSIS — Z6836 Body mass index (BMI) 36.0-36.9, adult: Secondary | ICD-10-CM | POA: Diagnosis not present

## 2021-10-24 DIAGNOSIS — I5032 Chronic diastolic (congestive) heart failure: Secondary | ICD-10-CM | POA: Diagnosis not present

## 2021-10-26 NOTE — Progress Notes (Signed)
  Subjective:  Patient ID: Katelyn Watson, female    DOB: [birth year],  MRN: [id number]  Katelyn Watson presents to clinic today for for at risk foot care. Patient has h/o PAD and painful, discolored, thick toenails which interfere with daily activities  Patient states she has a rash on the top of her left foot near 4th and 5th toes which is itchy. It has developed over the last few days. Denies any drainage, swelling, weeping or odor.  PCP is Ernestene Kiel, MD , and last visit was August 02, 2021.  Allergies  Allergen Reactions   Montelukast Sodium     Causes weakness  Other reaction(s): weakness, fatigue   Latex Rash   Tape Rash    Review of Systems: Negative except as noted in the HPI.  Objective: No changes noted in today's physical examination. General: Patient is a pleasant 76 y.o. Caucasian female in NAD. AAO x 3.   Neurovascular Examination: CFT <3 seconds b/l LE. Faintly palpable DP pulses b/l LE. Diminished PT pulse(s) b/l LE. No pain with calf compression b/l. Lower extremity skin temperature gradient within normal limits. Varicosities present b/l. Evidence of chronic venous insufficiency b/l LE. No ischemia or gangrene noted b/l LE. No cyanosis or clubbing noted b/l LE.  Protective sensation intact 5/5 intact bilaterally with 10g monofilament b/l. Vibratory sensation intact b/l.  Dermatological:  Pedal skin is warm and supple b/l LE. No open wounds b/l LE. No interdigital macerations noted b/l LE.  Skin eruption dorsolateral aspect of left forefoot area. No edema, no erythema, no drainage, no weeping. Toenails 1-5 bilaterally elongated, discolored, dystrophic, thickened, and crumbly with subungual debris and tenderness to dorsal palpation.  Musculoskeletal:  Muscle strength 5/5 to all LE muscle groups of b/l lower extremities. Hammertoe deformity noted 2-5 b/l. Assessment/Plan: 1. Pain due to onychomycosis of toenails of both feet   2. Rash of foot   3. PVD (peripheral  vascular disease) ([place])     -Patient was evaluated and treated. All patient's and/or POA's questions/concerns answered on today's visit. -For her rash, we discussed prescription for topical, but she states she has Cortizone 10 at home. She was advised to apply according to package directions. Call office if condition worsens and I will send a new Rx for her. She related understanding. -Mycotic toenails 1-5 bilaterally were debrided in length and girth with sterile nail nippers and dremel without incident. -Patient/POA to call should there be question/concern in the interim.   Return in about 3 months (around 01/18/2022).  Marzetta Board, DPM

## 2021-10-30 ENCOUNTER — Telehealth: Payer: Self-pay | Admitting: *Deleted

## 2021-10-30 DIAGNOSIS — I5032 Chronic diastolic (congestive) heart failure: Secondary | ICD-10-CM | POA: Diagnosis not present

## 2021-10-30 DIAGNOSIS — R0602 Shortness of breath: Secondary | ICD-10-CM | POA: Diagnosis not present

## 2021-10-30 DIAGNOSIS — I428 Other cardiomyopathies: Secondary | ICD-10-CM | POA: Diagnosis not present

## 2021-10-30 DIAGNOSIS — I519 Heart disease, unspecified: Secondary | ICD-10-CM | POA: Diagnosis not present

## 2021-10-30 DIAGNOSIS — I110 Hypertensive heart disease with heart failure: Secondary | ICD-10-CM | POA: Diagnosis not present

## 2021-10-30 MED ORDER — ENTRESTO 24-26 MG PO TABS: 1.0000 | ORAL_TABLET | Freq: Two times a day (BID) | ORAL | 5 refills | Status: AC

## 2021-10-30 NOTE — Telephone Encounter (Signed)
Pt wanted Entresto sent to [facility]

## 2021-10-30 NOTE — Addendum Note (Signed)
Addended by: Truddie Hidden on: 10/30/2021 02:27 PM   Modules accepted: Orders

## 2021-10-31 ENCOUNTER — Other Ambulatory Visit: Payer: Self-pay

## 2021-10-31 DIAGNOSIS — I428 Other cardiomyopathies: Secondary | ICD-10-CM

## 2021-10-31 LAB — BASIC METABOLIC PANEL
BUN/Creatinine Ratio: 11 — ABNORMAL LOW (ref 12–28)
BUN: 10 mg/dL (ref 8–27)
CO2: 28 mmol/L (ref 20–29)
Calcium: 9.3 mg/dL (ref 8.7–10.3)
Chloride: 102 mmol/L (ref 96–106)
Creatinine, Ser: 0.89 mg/dL (ref 0.57–1.00)
Glucose: 104 mg/dL — ABNORMAL HIGH (ref 70–99)
Potassium: 3.9 mmol/L (ref 3.5–5.2)
Sodium: 142 mmol/L (ref 134–144)
eGFR: 67 mL/min/{1.73_m2} (ref >59)

## 2021-10-31 LAB — PRO B NATRIURETIC PEPTIDE: NT-Pro BNP: 572 pg/mL (ref 0–738)

## 2021-10-31 MED ORDER — SPIRONOLACTONE 25 MG PO TABS: 25.0000 mg | ORAL_TABLET | Freq: Every day | ORAL | 3 refills | Status: AC

## 2021-11-05 ENCOUNTER — Other Ambulatory Visit: Payer: Self-pay | Admitting: Urology

## 2021-11-05 DIAGNOSIS — N200 Calculus of kidney: Secondary | ICD-10-CM | POA: Diagnosis not present

## 2021-11-09 DIAGNOSIS — N390 Urinary tract infection, site not specified: Secondary | ICD-10-CM | POA: Diagnosis not present

## 2021-11-09 DIAGNOSIS — N200 Calculus of kidney: Secondary | ICD-10-CM | POA: Diagnosis not present

## 2021-11-13 DIAGNOSIS — I428 Other cardiomyopathies: Secondary | ICD-10-CM | POA: Diagnosis not present

## 2021-11-14 LAB — BASIC METABOLIC PANEL
BUN/Creatinine Ratio: 5 — ABNORMAL LOW (ref 12–28)
BUN: 5 mg/dL — ABNORMAL LOW (ref 8–27)
CO2: 25 mmol/L (ref 20–29)
Calcium: 9.9 mg/dL (ref 8.7–10.3)
Chloride: 100 mmol/L (ref 96–106)
Creatinine, Ser: 1.08 mg/dL — ABNORMAL HIGH (ref 0.57–1.00)
Glucose: 105 mg/dL — ABNORMAL HIGH (ref 70–99)
Potassium: 4.6 mmol/L (ref 3.5–5.2)
Sodium: 140 mmol/L (ref 134–144)
eGFR: 53 mL/min/{1.73_m2} — ABNORMAL LOW (ref >59)

## 2021-11-15 NOTE — Progress Notes (Unsigned)
Cardiology Office Note:    Date:  11/16/2021   ID:  Katelyn Watson, DOB February 28, 1946, MRN [id number]  PCP:  Ernestene Kiel, MD  Cardiologist:  Shirlee More, MD    Referring MD: Ernestene Kiel, MD    ASSESSMENT:    1. Hypertensive heart disease with chronic diastolic congestive heart failure (HCC)   2. Other cardiomyopathy (HCC)    PLAN:    In order of problems listed above:  Improved BP is at target she is on good therapy including her loop diuretic MRA Entresto carvedilol and with her urinary problems including infection.  I would not place her on SGLT2 inhibitor.  In my opinion she is optimized for the planned urologic surgery Mildly reduced ejection fraction compensated on good medical therapy   Next appointment: 6 months   Medication Adjustments/Labs and Tests Ordered: Current medicines are reviewed at length with the patient today.  Concerns regarding medicines are outlined above.  No orders of the defined types were placed in this encounter.  No orders of the defined types were placed in this encounter.   Chief Complaint  Patient presents with   Clearance 11/27/21    Dr. Lovena Neighbours at [facility]  11/27/21 kidney stone blast    History of Present Illness:    Katelyn Watson is a 76 y.o. female with a hx of heart failure cardiomyopathy ejection fraction mildly reduced 45 to 50% mitral annular calcification with mild to moderate mitral regurgitation hypertension with stage III CKD anemia hyperlipidemia and history of breast cancer with neoadjuvant chemotherapy mastectomy postoperative chemotherapy and adjuvant radiation therapy last seen 10/16/2021.  She was initiated on a loop diuretic transition from [facility] to [facility].  Her proBNP level was not significantly elevated 572.  Compliance with diet, lifestyle and medications: Yes  She was transitioned to [facility] was on good foundational therapy and her heart failure is compensated without edema or shortness of  breath She is suffering from a renal calculus and is scheduled for lithotripsy at the end of the month she is optimized. No edema chest pain orthopnea palpitation or syncope Follow-up labs on her distal diuretic and Entresto she will normal GFR and potassium 3.9 Past Medical History:  Diagnosis Date   Acquired trigger finger of left ring finger 03/03/2018   Anemia    hx of    Arthritis    Asthma 08/02/2016   Breast cancer ([place])    treated with masectomy, chemo, and xrt   Degeneration of lumbar intervertebral disc 09/12/2017   Depression    DIASTOLIC DYSFUNCTION 82/95/6213   Qualifier: Diagnosis of  By: Gwenette Greet MD, Armando Reichert    Family history of anesthesia complication    brother- became stiff after sinus surgery    GERD (gastroesophageal reflux disease)    H/O Hashimoto thyroiditis 09/12/2016   History of bilateral breast cancer 08/09/2021   HLD (hyperlipidemia)    HYPERTENSION 11/28/2008   Qualifier: Diagnosis of  By: Doy Mince LPN, Megan     Hypothyroidism    Laryngospasms 01/14/2018   Morbid obesity ([place]) 01/19/2014   Non-healing wound of upper extremity 08/09/2021   Pure hypercholesterolemia 07/19/2021   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   Shortness of breath    slight with exertion    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    Past Surgical History:  Procedure Laterality Date   ABDOMINAL HYSTERECTOMY  2004   CARDIAC CATHETERIZATION     DILATION AND CURETTAGE OF UTERUS  06/03/2000   MASTECTOMY Left 06/03/1997   MASTECTOMY  Right 08/2016   TONSILLECTOMY  1951   and adenoidectomy    TOTAL KNEE ARTHROPLASTY Left 01/17/2014   Procedure: LEFT TOTAL KNEE ARTHROPLASTY;  Surgeon: Mauri Pole, MD;  Location: [facility];  Service: Orthopedics;  Laterality: Left;   TRIGGER FINGER RELEASE Bilateral    2018 Right 4th finger; 2019 Left 4th finger release   VARICOSE VEIN SURGERY     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    Current Medications: Current Meds  Medication Sig   albuterol  (PROVENTIL) (2.5 MG/3ML) 0.083% nebulizer solution Take 2.5 mg by nebulization every 6 (six) hours as needed for wheezing or shortness of breath.   ascorbic acid (VITAMIN C) 1000 MG tablet Take 1 tablet by mouth daily.   aspirin 81 MG EC tablet Take 81 mg by mouth daily.   atorvastatin (LIPITOR) 10 MG tablet Take 10 mg by mouth daily.   Calcium Carb-Cholecalciferol (CALTRATE 600+D3) 600-800 MG-UNIT TABS Take 1 tablet by mouth daily.   carvedilol (COREG) 12.5 MG tablet Take 12.5 mg by mouth 2 (two) times daily with a meal.    diclofenac (VOLTAREN) 75 MG EC tablet Take 75 mg by mouth daily.   furosemide (LASIX) 20 MG tablet Take 1 tablet (20 mg total) by mouth daily.   hydroxychloroquine (PLAQUENIL) 200 MG tablet Take 200 mg by mouth 2 (two) times daily.   hyoscyamine (ANASPAZ) 0.125 MG TBDP disintergrating tablet Take 0.125 mg by mouth every 4 (four) hours as needed for cramping.   latanoprost (XALATAN) 0.005 % ophthalmic solution Place 1 drop into both eyes at bedtime.   levothyroxine (SYNTHROID) 125 MCG tablet Take 125 mcg by mouth every morning.   levothyroxine (SYNTHROID) 137 MCG tablet Take 137 mcg by mouth daily before breakfast.   Multiple Vitamins-Minerals (CENTRUM SILVER PO) Take 1 tablet by mouth daily.     nitrofurantoin, macrocrystal-monohydrate, (MACROBID) 100 MG capsule Take 100 mg by mouth every 12 (twelve) hours.   nystatin cream (MYCOSTATIN) Apply 1 application  topically 2 (two) times daily as needed for dry skin (yeast).   omeprazole (PRILOSEC) 20 MG capsule Take 20 mg by mouth 2 (two) times daily before a meal.   Polyethyl Glycol-Propyl Glycol (SYSTANE) 0.4-0.3 % SOLN Place 1-2 drops into both eyes daily as needed (dry eyes).   potassium chloride (KLOR-CON) 10 MEQ tablet Take 10 mEq by mouth 2 (two) times daily.   promethazine (PHENERGAN) 25 MG tablet Take 25 mg by mouth 2 (two) times daily.   sacubitril-valsartan (ENTRESTO) 24-26 MG Take 1 tablet by mouth 2 (two) times daily.    sertraline (ZOLOFT) 100 MG tablet Take 100 mg by mouth daily.   spironolactone (ALDACTONE) 25 MG tablet Take 1 tablet (25 mg total) by mouth daily.   sulfamethoxazole-trimethoprim (BACTRIM DS) 800-160 MG tablet Take 1 tablet by mouth 2 (two) times daily.   traZODone (DESYREL) 150 MG tablet Take 150 mg by mouth at bedtime as needed for sleep.     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    Pt has traveled to [facility] and the beach in the past few months,    Social Determinants of Health   Financial Resource Strain: Not on file  Food Insecurity: Not on file  Transportation Needs: Not on file  Physical Activity: Not on file  Stress: Not on file  Social Connections: Not on file     Family History: The patient's family history includes Atrial fibrillation in her father; Bladder Cancer in her brother; Cancer in an other family member; Colon cancer in her mother; Diabetes in her brother; Endometrial cancer in her mother; Heart attack in her brother and mother; Heart disease in her father and another family member; Hypertension in her brother, father, and mother; Lung cancer in her brother. ROS:   Please see the history of present illness.    All other systems reviewed and are negative.  EKGs/Labs/Other Studies Reviewed:    The following studies were reviewed today:  EKG:  EKG ordered today and personally reviewed.   The ekg ordered today demonstrates sinus rhythm she has APCs nonspecific ST changes LVH voltage.  Recent Labs: 10/30/2021: NT-Pro BNP 572 11/13/2021: BUN 5; Creatinine, Ser 1.08; Potassium 4.6; Sodium 140  Recent Lipid Panel No results found for: "CHOL", "TRIG", "HDL", "CHOLHDL", "VLDL", "LDLCALC", "LDLDIRECT"  Physical Exam:    VS:  BP 108/64 (BP Location: Right Arm, Patient Position: Sitting)   Pulse 62   Ht 5' 4.5" (1.638 m)   Wt 205 lb (93 kg)   SpO2 95%   BMI 34.64 kg/m     Wt Readings from Last 3 Encounters:  11/16/21 205 lb (93 kg)  10/16/21 215 lb (97.5 kg)  08/27/21 220 lb (99.8 kg)     GEN:  Well nourished, well developed in no acute distress HEENT: Normal NECK: No JVD; No carotid bruits LYMPHATICS: No lymphadenopathy CARDIAC: RRR, no murmurs, rubs, gallops RESPIRATORY:  Clear to auscultation without rales, wheezing or rhonchi  ABDOMEN: Soft, non-tender, non-distended MUSCULOSKELETAL:  No edema; No deformity  SKIN: Warm and dry NEUROLOGIC:  Alert and oriented x 3 PSYCHIATRIC:  Normal affect    Signed, Shirlee More, MD  11/16/2021 11:22 AM    [place]

## 2021-11-16 ENCOUNTER — Encounter: Payer: Self-pay | Admitting: Cardiology

## 2021-11-16 ENCOUNTER — Ambulatory Visit (INDEPENDENT_AMBULATORY_CARE_PROVIDER_SITE_OTHER): Payer: Medicare Other | Admitting: Cardiology

## 2021-11-16 VITALS — BP 108/64 | HR 62 | Ht 64.5 in | Wt 205.0 lb

## 2021-11-16 DIAGNOSIS — I428 Other cardiomyopathies: Secondary | ICD-10-CM

## 2021-11-16 DIAGNOSIS — I5032 Chronic diastolic (congestive) heart failure: Secondary | ICD-10-CM

## 2021-11-16 DIAGNOSIS — I110 Hypertensive heart disease with heart failure: Secondary | ICD-10-CM

## 2021-11-16 NOTE — Patient Instructions (Addendum)
Medication Instructions:  Your physician recommends that you continue on your current medications as directed. Please refer to the Current Medication list given to you today.  *If you need a refill on your cardiac medications before your next appointment, please call your pharmacy*   Lab Work: None Ordered If you have labs (blood work) drawn today and your tests are completely normal, you will receive your results only by: MyChart Message (if you have MyChart) OR A paper copy in the mail If you have any lab test that is abnormal or we need to change your treatment, we will call you to review the results.   Testing/Procedures: None Ordered   Follow-Up: At [facility], you and your health needs are our priority.  As part of our continuing mission to provide you with exceptional heart care, we have created designated Provider Care Teams.  These Care Teams include your primary Cardiologist (physician) and Advanced Practice Providers (APPs -  Physician Assistants and Nurse Practitioners) who all work together to provide you with the care you need, when you need it.  We recommend signing up for the patient portal called "MyChart".  Sign up information is provided on this After Visit Summary.  MyChart is used to connect with patients for Virtual Visits (Telemedicine).  Patients are able to view lab/test results, encounter notes, upcoming appointments, etc.  Non-urgent messages can be sent to your provider as well.   To learn more about what you can do with MyChart, go to [email or web address].    Your next appointment:   6 month(s)  The format for your next appointment:   In Person  Provider:   Brian Munley, MD    Other Instructions NA  

## 2021-11-19 DIAGNOSIS — N200 Calculus of kidney: Secondary | ICD-10-CM | POA: Diagnosis not present

## 2021-11-21 ENCOUNTER — Encounter (HOSPITAL_BASED_OUTPATIENT_CLINIC_OR_DEPARTMENT_OTHER): Payer: Self-pay | Admitting: Urology

## 2021-11-21 NOTE — Progress Notes (Signed)
Spoke w/ via phone for pre-op interview--- [place]----  ISTAT             Lab results------ COVID test -----patient states asymptomatic no test needed Arrive at -------0800 NPO after MN NO Solid Food.  Clear liquids from MN until---0700 Med rec completed Medications to take morning of surgery ----- Coreg, Levothyroxine, Prilosec, and Zoloft. Diabetic medication ----- Patient instructed no nail polish to be worn day of surgery Patient instructed to bring photo id and insurance card day of surgery Patient aware to have Driver (ride ) / caregiver Katelyn Watson   for 24 hours after surgery  Patient Special Instructions ----- Pre-Op special Istructions ----- Patient verbalized understanding of instructions that were given at this phone interview. Patient denies shortness of breath, chest pain, fever, cough at this phone interview.   **Cardiac clearance from Dr Bettina Gavia note in [place] dated 11/16/21.

## 2021-11-26 DIAGNOSIS — D649 Anemia, unspecified: Secondary | ICD-10-CM | POA: Diagnosis not present

## 2021-11-26 DIAGNOSIS — K219 Gastro-esophageal reflux disease without esophagitis: Secondary | ICD-10-CM | POA: Diagnosis not present

## 2021-11-26 DIAGNOSIS — F418 Other specified anxiety disorders: Secondary | ICD-10-CM | POA: Diagnosis not present

## 2021-11-26 DIAGNOSIS — I10 Essential (primary) hypertension: Secondary | ICD-10-CM | POA: Diagnosis not present

## 2021-11-26 DIAGNOSIS — C50911 Malignant neoplasm of unspecified site of right female breast: Secondary | ICD-10-CM | POA: Diagnosis not present

## 2021-11-26 DIAGNOSIS — Z170 Estrogen receptor positive status [ER+]: Secondary | ICD-10-CM | POA: Diagnosis not present

## 2021-11-26 DIAGNOSIS — N200 Calculus of kidney: Secondary | ICD-10-CM | POA: Diagnosis not present

## 2021-11-26 DIAGNOSIS — E785 Hyperlipidemia, unspecified: Secondary | ICD-10-CM | POA: Diagnosis not present

## 2021-11-26 DIAGNOSIS — E063 Autoimmune thyroiditis: Secondary | ICD-10-CM | POA: Diagnosis not present

## 2021-11-26 DIAGNOSIS — C50912 Malignant neoplasm of unspecified site of left female breast: Secondary | ICD-10-CM | POA: Diagnosis not present

## 2021-11-27 ENCOUNTER — Ambulatory Visit (HOSPITAL_BASED_OUTPATIENT_CLINIC_OR_DEPARTMENT_OTHER): Payer: Medicare Other | Admitting: Anesthesiology

## 2021-11-27 ENCOUNTER — Encounter (HOSPITAL_BASED_OUTPATIENT_CLINIC_OR_DEPARTMENT_OTHER)
Admission: RE | Disposition: A | Discharge status: Home / Self Care | Payer: Self-pay | Source: Home / Self Care | Attending: Urology

## 2021-11-27 ENCOUNTER — Other Ambulatory Visit: Payer: Self-pay

## 2021-11-27 ENCOUNTER — Encounter (HOSPITAL_BASED_OUTPATIENT_CLINIC_OR_DEPARTMENT_OTHER): Payer: Self-pay | Admitting: Urology

## 2021-11-27 ENCOUNTER — Ambulatory Visit (HOSPITAL_BASED_OUTPATIENT_CLINIC_OR_DEPARTMENT_OTHER)
Admission: RE | Admit: 2021-11-27 | Discharge: 2021-11-27 | Disposition: A | Discharge status: Home / Self Care | Payer: Medicare Other | Attending: Urology | Admitting: Urology

## 2021-11-27 DIAGNOSIS — E039 Hypothyroidism, unspecified: Secondary | ICD-10-CM | POA: Insufficient documentation

## 2021-11-27 DIAGNOSIS — N200 Calculus of kidney: Secondary | ICD-10-CM | POA: Insufficient documentation

## 2021-11-27 DIAGNOSIS — J45909 Unspecified asthma, uncomplicated: Secondary | ICD-10-CM | POA: Diagnosis not present

## 2021-11-27 DIAGNOSIS — I10 Essential (primary) hypertension: Secondary | ICD-10-CM | POA: Diagnosis not present

## 2021-11-27 DIAGNOSIS — K219 Gastro-esophageal reflux disease without esophagitis: Secondary | ICD-10-CM | POA: Insufficient documentation

## 2021-11-27 DIAGNOSIS — I519 Heart disease, unspecified: Secondary | ICD-10-CM

## 2021-11-27 DIAGNOSIS — Z87440 Personal history of urinary (tract) infections: Secondary | ICD-10-CM | POA: Diagnosis not present

## 2021-11-27 DIAGNOSIS — N390 Urinary tract infection, site not specified: Secondary | ICD-10-CM | POA: Diagnosis not present

## 2021-11-27 DIAGNOSIS — Z7722 Contact with and (suspected) exposure to environmental tobacco smoke (acute) (chronic): Secondary | ICD-10-CM | POA: Diagnosis not present

## 2021-11-27 HISTORY — PX: CYSTOSCOPY/URETEROSCOPY/HOLMIUM LASER/STENT PLACEMENT: SHX6546

## 2021-11-27 HISTORY — DX: Personal history of urinary calculi: Z87.442

## 2021-11-27 LAB — POCT I-STAT, CHEM 8
BUN: 16 mg/dL (ref 8–23)
Calcium, Ion: 0.83 mmol/L — CL (ref 1.15–1.40)
Chloride: 110 mmol/L (ref 98–111)
Creatinine, Ser: 1.3 mg/dL — ABNORMAL HIGH (ref 0.44–1.00)
Glucose, Bld: 94 mg/dL (ref 70–99)
HCT: 34 % — ABNORMAL LOW (ref 36.0–46.0)
Hemoglobin: 11.6 g/dL — ABNORMAL LOW (ref 12.0–15.0)
Potassium: 3.9 mmol/L (ref 3.5–5.1)
Sodium: 138 mmol/L (ref 135–145)
TCO2: 21 mmol/L — ABNORMAL LOW (ref 22–32)

## 2021-11-27 SURGERY — CYSTOSCOPY/URETEROSCOPY/HOLMIUM LASER/STENT PLACEMENT
Anesthesia: General | Site: Renal | Laterality: Bilateral

## 2021-11-27 MED ORDER — PHENYLEPHRINE 80 MCG/ML (10ML) SYRINGE FOR IV PUSH (FOR BLOOD PRESSURE SUPPORT)
PREFILLED_SYRINGE | INTRAVENOUS | Status: DC | PRN
  Administered 2021-11-27: 160 ug via INTRAVENOUS

## 2021-11-27 MED ORDER — PROPOFOL 10 MG/ML IV BOLUS
INTRAVENOUS | Status: DC | PRN
  Administered 2021-11-27: 30 mg via INTRAVENOUS
  Administered 2021-11-27: 150 mg via INTRAVENOUS
  Administered 2021-11-27 (×2): 50 mg via INTRAVENOUS

## 2021-11-27 MED ORDER — OXYCODONE-ACETAMINOPHEN 5-325 MG PO TABS: 1.0000 | ORAL_TABLET | ORAL | 0 refills | Status: AC | PRN

## 2021-11-27 MED ORDER — OXYBUTYNIN CHLORIDE 5 MG PO TABS: 5.0000 mg | ORAL_TABLET | Freq: Three times a day (TID) | ORAL | 1 refills | Status: AC | PRN

## 2021-11-27 MED ORDER — LIDOCAINE 2% (20 MG/ML) 5 ML SYRINGE
INTRAMUSCULAR | Status: DC | PRN
  Administered 2021-11-27: 100 mg via INTRAVENOUS

## 2021-11-27 MED ORDER — SODIUM CHLORIDE 0.9 % IR SOLN
Status: DC | PRN
  Administered 2021-11-27: 3000 mL

## 2021-11-27 MED ORDER — OXYCODONE HCL 5 MG PO TABS: 5.0000 mg | ORAL_TABLET | Freq: Once | ORAL | Status: DC | PRN

## 2021-11-27 MED ORDER — PHENAZOPYRIDINE HCL 200 MG PO TABS: 200.0000 mg | ORAL_TABLET | Freq: Three times a day (TID) | ORAL | 0 refills | Status: AC | PRN

## 2021-11-27 MED ORDER — CEFAZOLIN SODIUM-DEXTROSE 2-4 GM/100ML-% IV SOLN
2.0000 g | Freq: Once | INTRAVENOUS | Status: AC
  Administered 2021-11-27: 2 g via INTRAVENOUS

## 2021-11-27 MED ORDER — LACTATED RINGERS IV SOLN
INTRAVENOUS | Status: DC
  Administered 2021-11-27: 1000 mL via INTRAVENOUS

## 2021-11-27 MED ORDER — ONDANSETRON HCL 4 MG/2ML IJ SOLN
INTRAMUSCULAR | Status: DC | PRN
  Administered 2021-11-27: 4 mg via INTRAVENOUS

## 2021-11-27 MED ORDER — 0.9 % SODIUM CHLORIDE (POUR BTL) OPTIME
TOPICAL | Status: DC | PRN
  Administered 2021-11-27: 500 mL

## 2021-11-27 MED ORDER — FENTANYL CITRATE (PF) 100 MCG/2ML IJ SOLN: 25.0000 ug | INTRAMUSCULAR | Status: DC | PRN

## 2021-11-27 MED ORDER — IOHEXOL 300 MG/ML  SOLN
INTRAMUSCULAR | Status: DC | PRN
  Administered 2021-11-27: 10 mL via URETHRAL

## 2021-11-27 MED ORDER — ACETAMINOPHEN 500 MG PO TABS
1000.0000 mg | ORAL_TABLET | Freq: Once | ORAL | Status: AC
  Administered 2021-11-27: 1000 mg via ORAL

## 2021-11-27 MED ORDER — PROPOFOL 10 MG/ML IV BOLUS
INTRAVENOUS | Status: AC
  Filled 2021-11-27: qty 20

## 2021-11-27 MED ORDER — CEFAZOLIN SODIUM-DEXTROSE 2-4 GM/100ML-% IV SOLN
INTRAVENOUS | Status: AC
  Filled 2021-11-27: qty 100

## 2021-11-27 MED ORDER — DEXAMETHASONE SODIUM PHOSPHATE 10 MG/ML IJ SOLN
INTRAMUSCULAR | Status: DC | PRN
  Administered 2021-11-27: 10 mg via INTRAVENOUS

## 2021-11-27 MED ORDER — FENTANYL CITRATE (PF) 100 MCG/2ML IJ SOLN
INTRAMUSCULAR | Status: DC | PRN
  Administered 2021-11-27: 25 ug via INTRAVENOUS
  Administered 2021-11-27 (×4): 50 ug via INTRAVENOUS

## 2021-11-27 MED ORDER — EPHEDRINE SULFATE-NACL 50-0.9 MG/10ML-% IV SOSY
PREFILLED_SYRINGE | INTRAVENOUS | Status: DC | PRN
  Administered 2021-11-27: 10 mg via INTRAVENOUS

## 2021-11-27 MED ORDER — CEPHALEXIN 500 MG PO CAPS: 500.0000 mg | ORAL_CAPSULE | Freq: Two times a day (BID) | ORAL | 1 refills | Status: AC

## 2021-11-27 MED ORDER — OXYCODONE HCL 5 MG/5ML PO SOLN: 5.0000 mg | Freq: Once | ORAL | Status: DC | PRN

## 2021-11-27 MED ORDER — ACETAMINOPHEN 500 MG PO TABS
ORAL_TABLET | ORAL | Status: AC
  Filled 2021-11-27: qty 2

## 2021-11-27 MED ORDER — ONDANSETRON HCL 4 MG/2ML IJ SOLN
INTRAMUSCULAR | Status: AC
  Filled 2021-11-27: qty 2

## 2021-11-27 MED ORDER — LIDOCAINE HCL (PF) 2 % IJ SOLN
INTRAMUSCULAR | Status: AC
  Filled 2021-11-27: qty 5

## 2021-11-27 MED ORDER — PHENYLEPHRINE 80 MCG/ML (10ML) SYRINGE FOR IV PUSH (FOR BLOOD PRESSURE SUPPORT)
PREFILLED_SYRINGE | INTRAVENOUS | Status: AC
  Filled 2021-11-27: qty 10

## 2021-11-27 MED ORDER — DEXAMETHASONE SODIUM PHOSPHATE 10 MG/ML IJ SOLN
INTRAMUSCULAR | Status: AC
  Filled 2021-11-27: qty 1

## 2021-11-27 MED ORDER — FENTANYL CITRATE (PF) 250 MCG/5ML IJ SOLN
INTRAMUSCULAR | Status: AC
  Filled 2021-11-27: qty 5

## 2021-11-27 MED ORDER — EPHEDRINE 5 MG/ML INJ
INTRAVENOUS | Status: AC
  Filled 2021-11-27: qty 5

## 2021-11-27 SURGICAL SUPPLY — 27 items
APL SKNCLS STERI-STRIP NONHPOA (GAUZE/BANDAGES/DRESSINGS)
BAG DRAIN URO-CYSTO SKYTR STRL (DRAIN) ×2 IMPLANT
BAG DRN UROCATH (DRAIN) ×1
BASKET STONE 1.7 NGAGE (UROLOGICAL SUPPLIES) ×1 IMPLANT
BASKET ZERO TIP NITINOL 2.4FR (BASKET) ×1 IMPLANT
BENZOIN TINCTURE PRP APPL 2/3 (GAUZE/BANDAGES/DRESSINGS) IMPLANT
BSKT STON RTRVL ZERO TP 2.4FR (BASKET)
CATH URET 5FR 28IN OPEN ENDED (CATHETERS) ×1 IMPLANT
CLOTH BEACON ORANGE TIMEOUT ST (SAFETY) ×2 IMPLANT
FIBER LASER FLEXIVA 365 (UROLOGICAL SUPPLIES) IMPLANT
GLOVE BIO SURGEON STRL SZ7.5 (GLOVE) ×2 IMPLANT
GOWN STRL REUS W/TWL XL LVL3 (GOWN DISPOSABLE) ×2 IMPLANT
GUIDEWIRE STR DUAL SENSOR (WIRE) ×1 IMPLANT
GUIDEWIRE ZIPWRE .038 STRAIGHT (WIRE) ×2 IMPLANT
IV NS IRRIG 3000ML ARTHROMATIC (IV SOLUTION) ×4 IMPLANT
KIT TURNOVER CYSTO (KITS) ×2 IMPLANT
MANIFOLD NEPTUNE II (INSTRUMENTS) ×2 IMPLANT
NS IRRIG 500ML POUR BTL (IV SOLUTION) ×2 IMPLANT
PACK CYSTO (CUSTOM PROCEDURE TRAY) ×2 IMPLANT
SHEATH URETERAL 12FRX35CM (MISCELLANEOUS) ×1 IMPLANT
STENT URET 6FRX24 CONTOUR (STENTS) ×1 IMPLANT
STRIP CLOSURE SKIN 1/2X4 (GAUZE/BANDAGES/DRESSINGS) IMPLANT
SYR 10ML LL (SYRINGE) ×2 IMPLANT
TRACTIP FLEXIVA PULS ID 200XHI (Laser) IMPLANT
TRACTIP FLEXIVA PULSE ID 200 (Laser) ×2
TUBE CONNECTING 12X1/4 (SUCTIONS) ×1 IMPLANT
TUBING UROLOGY SET (TUBING) ×2 IMPLANT

## 2021-11-27 NOTE — Anesthesia Preprocedure Evaluation (Addendum)
Anesthesia Evaluation  Patient identified by MRN, date of birth, ID band Patient awake    Reviewed: Allergy & Precautions, NPO status , Patient's Chart, lab work & pertinent test results, reviewed documented beta blocker date and time   History of Anesthesia Complications Negative for: history of anesthetic complications  Airway Mallampati: II  TM Distance: >3 FB Neck ROM: Full    Dental no notable dental hx.    Pulmonary asthma ,    Pulmonary exam normal        Cardiovascular hypertension, Pt. on medications and Pt. on home beta blockers Normal cardiovascular exam  TTE 08/2021: EF 45-50%, mild LVH, moderate LAE, mild-moderate MR, mild TR   Neuro/Psych Depression negative neurological ROS     GI/Hepatic Neg liver ROS, GERD  ,  Endo/Other  Hypothyroidism   Renal/GU Cr 1.08  negative genitourinary   Musculoskeletal negative musculoskeletal ROS (+)   Abdominal   Peds  Hematology negative hematology ROS (+)   Anesthesia Other Findings Hx of vocal fold atrophy, laryngospasm  Reproductive/Obstetrics negative OB ROS                           Anesthesia Physical Anesthesia Plan  ASA: 3  Anesthesia Plan: General   Post-op Pain Management: Tylenol PO (pre-op)*   Induction: Intravenous  PONV Risk Score and Plan: 3 and Treatment may vary due to age or medical condition, Ondansetron and Dexamethasone  Airway Management Planned: LMA  Additional Equipment: None  Intra-op Plan:   Post-operative Plan: Extubation in OR  Informed Consent: I have reviewed the patients History and Physical, chart, labs and discussed the procedure including the risks, benefits and alternatives for the proposed anesthesia with the patient or authorized representative who has indicated his/her understanding and acceptance.     Dental advisory given  Plan Discussed with: CRNA  Anesthesia Plan Comments:         Anesthesia Quick Evaluation

## 2021-11-27 NOTE — Op Note (Signed)
Operative Note  Preoperative diagnosis:  1.  Bilateral renal stones 2.  Recurrent UTIs  Postoperative diagnosis: 1.  Bilateral renal stones 2.  Recurrent UTIs 3.  Possible colovesical fistula  Procedure(s): 1.  Cystoscopy with right ureteroscopy, holmium laser lithotripsy and right JJ stent placement 2.  Right retrograde pyelogram with intraoperative interpretation fluoroscopic imaging  Surgeon: Rhoderick Moody, MD  Assistants:  None  Anesthesia:  General  Complications:  None  EBL: Less than 5 mL  Specimens: 1.  None  Drains/Catheters: 1.  Right 6 French, 24 cm JJ stent without tether  Intraoperative findings:   Erythematous and edematous lesion seen involving the posterior aspects of the bladder with what appeared to be purulent discharge from the central orifice.  This is concerning for colovesical fistula, especially given her recent history of acute diverticulitis. Numerous large right renal stones  Indication:  Katelyn Watson is a 76 y.o. female with a history of recurrent urinary tract infections, acute diverticulitis and bilateral renal stones.  She has been consented for the above procedures, voices understanding wish to proceed.  Description of procedure:  After informed consent was obtained, the patient was brought to the operating room and general LMA anesthesia was administered. The patient was then placed in the dorsolithotomy position and prepped and draped in the usual sterile fashion. A timeout was performed. A 23 French rigid cystoscope was then inserted into the urethral meatus and advanced into the bladder under direct vision. A complete bladder survey revealed the findings listed above.  A 5 French ureteral catheter was then inserted into the right ureteral orifice and a retrograde pyelogram was obtained, with the findings listed above.  A Glidewire was then used to intubate the lumen of the ureteral catheter and was advanced up to the right renal  pelvis, under fluoroscopic guidance.  The catheter was then removed, leaving the wire in place.  An additional sensor wire was then advanced up the right ureter to the right renal pelvis, fluoroscopic guidance.  A 35 cm, 14 French ureteral access sheath was then advanced over the sensor wire and into good position within the proximal aspect of the right ureter, confirming placement via fluoroscopy.  A flexible ureteroscope was then advanced through the ureteral access sheath and into the right renal pelvis, identifying numerous large renal stones.  A 200 m holmium laser was then used to fracture the stones into numerous smaller pieces.  After 1 hour of laser lithotripsy, she still had significant stone burden.  I then made a decision to leave a ureteral stent and bring her back in a staged fashion.  The flexible ureteroscope and ureteral access sheath were then removed under direct vision, identifying no evidence of ureteral trauma or intraluminal stone burden.  A 6 French, 24 cm JJ stent was then advanced over the wire and into good position within the right collecting system, confirming placement via fluoroscopy.  The patient's bladder was drained.  She tolerated the procedure well and was transferred to the postanesthesia in stable condition.  Plan: I will make arrangements for a consultation with general surgery to assess her for possible colovesical fistula.  Plan for repeat ureteroscopy in 1 to 2 weeks.

## 2021-11-28 ENCOUNTER — Encounter (HOSPITAL_BASED_OUTPATIENT_CLINIC_OR_DEPARTMENT_OTHER): Payer: Self-pay | Admitting: Urology

## 2021-11-29 ENCOUNTER — Encounter (HOSPITAL_BASED_OUTPATIENT_CLINIC_OR_DEPARTMENT_OTHER): Payer: Self-pay | Admitting: Urology

## 2021-11-29 ENCOUNTER — Other Ambulatory Visit: Payer: Self-pay | Admitting: Urology

## 2021-11-29 NOTE — Progress Notes (Addendum)
Spoke w/ via phone for pre-op interview---[facility] needs dos---- ISTAT              Lab results------Current EKG dated 11/16/21. COVID test -----patient states asymptomatic no test needed Arrive at ------- 1045 NPO after MN NO Solid Food.  Clear liquids from MN until---0945 Med rec completed Medications to take morning of surgery -----Coreg, Levothyroxine, Prilosec, Zoloft. Percocet, Pyridium and Oxybutin PRN Diabetic medication ----- Patient instructed no nail polish to be worn day of surgery Patient instructed to bring photo id and insurance card day of surgery Patient aware to have Driver (ride ) / caregiver  Monia Pouch  for 24 hours after surgery  Patient Special Instructions ----- Pre-Op special Istructions ----- Patient verbalized understanding of instructions that were given at this phone interview. Patient denies shortness of breath, chest pain, fever, cough at this phone interview.   ** Cardiologist Dr. Bettina Gavia. LOV 11/16/21. Cardiac clearance in note.

## 2021-12-05 ENCOUNTER — Ambulatory Visit (HOSPITAL_BASED_OUTPATIENT_CLINIC_OR_DEPARTMENT_OTHER): Payer: Medicare Other | Admitting: Anesthesiology

## 2021-12-05 ENCOUNTER — Other Ambulatory Visit: Payer: Self-pay

## 2021-12-05 ENCOUNTER — Ambulatory Visit (HOSPITAL_BASED_OUTPATIENT_CLINIC_OR_DEPARTMENT_OTHER)
Admission: RE | Admit: 2021-12-05 | Discharge: 2021-12-05 | Disposition: A | Discharge status: Home / Self Care | Payer: Medicare Other | Attending: Urology | Admitting: Urology

## 2021-12-05 ENCOUNTER — Encounter (HOSPITAL_BASED_OUTPATIENT_CLINIC_OR_DEPARTMENT_OTHER)
Admission: RE | Disposition: A | Discharge status: Home / Self Care | Payer: Self-pay | Source: Home / Self Care | Attending: Urology

## 2021-12-05 ENCOUNTER — Encounter (HOSPITAL_BASED_OUTPATIENT_CLINIC_OR_DEPARTMENT_OTHER): Payer: Self-pay | Admitting: Urology

## 2021-12-05 DIAGNOSIS — I509 Heart failure, unspecified: Secondary | ICD-10-CM | POA: Diagnosis not present

## 2021-12-05 DIAGNOSIS — N390 Urinary tract infection, site not specified: Secondary | ICD-10-CM

## 2021-12-05 DIAGNOSIS — I519 Heart disease, unspecified: Secondary | ICD-10-CM

## 2021-12-05 DIAGNOSIS — I110 Hypertensive heart disease with heart failure: Secondary | ICD-10-CM

## 2021-12-05 DIAGNOSIS — Z7722 Contact with and (suspected) exposure to environmental tobacco smoke (acute) (chronic): Secondary | ICD-10-CM | POA: Diagnosis not present

## 2021-12-05 DIAGNOSIS — I5032 Chronic diastolic (congestive) heart failure: Secondary | ICD-10-CM

## 2021-12-05 DIAGNOSIS — N200 Calculus of kidney: Secondary | ICD-10-CM | POA: Diagnosis not present

## 2021-12-05 DIAGNOSIS — Z87440 Personal history of urinary (tract) infections: Secondary | ICD-10-CM | POA: Insufficient documentation

## 2021-12-05 HISTORY — PX: CYSTOSCOPY/URETEROSCOPY/HOLMIUM LASER/STENT PLACEMENT: SHX6546

## 2021-12-05 HISTORY — PX: CYSTOSCOPY W/ RETROGRADES: SHX1426

## 2021-12-05 LAB — POCT I-STAT, CHEM 8
BUN: 12 mg/dL (ref 8–23)
Calcium, Ion: 1.25 mmol/L (ref 1.15–1.40)
Chloride: 103 mmol/L (ref 98–111)
Creatinine, Ser: 1.3 mg/dL — ABNORMAL HIGH (ref 0.44–1.00)
Glucose, Bld: 103 mg/dL — ABNORMAL HIGH (ref 70–99)
HCT: 32 % — ABNORMAL LOW (ref 36.0–46.0)
Hemoglobin: 10.9 g/dL — ABNORMAL LOW (ref 12.0–15.0)
Potassium: 3.7 mmol/L (ref 3.5–5.1)
Sodium: 141 mmol/L (ref 135–145)
TCO2: 25 mmol/L (ref 22–32)

## 2021-12-05 SURGERY — CYSTOSCOPY/URETEROSCOPY/HOLMIUM LASER/STENT PLACEMENT
Anesthesia: General | Site: Ureter | Laterality: Left

## 2021-12-05 MED ORDER — DEXAMETHASONE SODIUM PHOSPHATE 4 MG/ML IJ SOLN
INTRAMUSCULAR | Status: DC | PRN
  Administered 2021-12-05: 4 mg via INTRAVENOUS

## 2021-12-05 MED ORDER — PHENYLEPHRINE HCL (PRESSORS) 10 MG/ML IV SOLN
INTRAVENOUS | Status: DC | PRN
  Administered 2021-12-05 (×2): 80 ug via INTRAVENOUS

## 2021-12-05 MED ORDER — FENTANYL CITRATE (PF) 100 MCG/2ML IJ SOLN
INTRAMUSCULAR | Status: AC
  Filled 2021-12-05: qty 2

## 2021-12-05 MED ORDER — SUCCINYLCHOLINE CHLORIDE 200 MG/10ML IV SOSY
PREFILLED_SYRINGE | INTRAVENOUS | Status: DC | PRN
  Administered 2021-12-05: 20 mg via INTRAVENOUS

## 2021-12-05 MED ORDER — ONDANSETRON HCL 4 MG/2ML IJ SOLN
INTRAMUSCULAR | Status: DC | PRN
  Administered 2021-12-05: 4 mg via INTRAVENOUS

## 2021-12-05 MED ORDER — LACTATED RINGERS IV SOLN: INTRAVENOUS | Status: DC

## 2021-12-05 MED ORDER — SODIUM CHLORIDE 0.9 % IR SOLN
Status: DC | PRN
  Administered 2021-12-05 (×2): 3000 mL

## 2021-12-05 MED ORDER — WHITE PETROLATUM EX OINT
TOPICAL_OINTMENT | CUTANEOUS | Status: AC
  Filled 2021-12-05: qty 5

## 2021-12-05 MED ORDER — PROPOFOL 10 MG/ML IV BOLUS
INTRAVENOUS | Status: DC | PRN
  Administered 2021-12-05 (×2): 20 mg via INTRAVENOUS
  Administered 2021-12-05: 50 mg via INTRAVENOUS
  Administered 2021-12-05: 120 mg via INTRAVENOUS

## 2021-12-05 MED ORDER — CEFAZOLIN SODIUM-DEXTROSE 2-4 GM/100ML-% IV SOLN
2.0000 g | INTRAVENOUS | Status: AC
  Administered 2021-12-05: 2 g via INTRAVENOUS

## 2021-12-05 MED ORDER — LIDOCAINE 2% (20 MG/ML) 5 ML SYRINGE
INTRAMUSCULAR | Status: DC | PRN
  Administered 2021-12-05: 40 mg via INTRAVENOUS

## 2021-12-05 MED ORDER — 0.9 % SODIUM CHLORIDE (POUR BTL) OPTIME
TOPICAL | Status: DC | PRN
  Administered 2021-12-05: 500 mL

## 2021-12-05 MED ORDER — CEFAZOLIN SODIUM-DEXTROSE 2-4 GM/100ML-% IV SOLN
INTRAVENOUS | Status: AC
  Filled 2021-12-05: qty 100

## 2021-12-05 MED ORDER — FENTANYL CITRATE (PF) 100 MCG/2ML IJ SOLN
INTRAMUSCULAR | Status: DC | PRN
  Administered 2021-12-05 (×4): 25 ug via INTRAVENOUS

## 2021-12-05 MED ORDER — IOHEXOL 300 MG/ML  SOLN
INTRAMUSCULAR | Status: DC | PRN
  Administered 2021-12-05: 7 mL via URETHRAL

## 2021-12-05 SURGICAL SUPPLY — 24 items
BAG DRAIN URO-CYSTO SKYTR STRL (DRAIN) ×3 IMPLANT
BAG DRN UROCATH (DRAIN) ×2
CATH URET 5FR 28IN OPEN ENDED (CATHETERS) ×1 IMPLANT
CLOTH BEACON ORANGE TIMEOUT ST (SAFETY) ×3 IMPLANT
COVER DOME SNAP 22 D (MISCELLANEOUS) ×1 IMPLANT
EXTRACTOR STONE 1.7FRX115CM (UROLOGICAL SUPPLIES) ×1 IMPLANT
GLOVE BIO SURGEON STRL SZ7.5 (GLOVE) ×3 IMPLANT
GLOVE BIOGEL PI IND STRL 7.0 (GLOVE) IMPLANT
GLOVE BIOGEL PI INDICATOR 7.0 (GLOVE) ×2
GOWN STRL REUS W/TWL XL LVL3 (GOWN DISPOSABLE) ×4 IMPLANT
GUIDEWIRE STR DUAL SENSOR (WIRE) ×1 IMPLANT
GUIDEWIRE ZIPWRE .038 STRAIGHT (WIRE) ×3 IMPLANT
IV NS IRRIG 3000ML ARTHROMATIC (IV SOLUTION) ×6 IMPLANT
KIT TURNOVER CYSTO (KITS) ×3 IMPLANT
MANIFOLD NEPTUNE II (INSTRUMENTS) ×3 IMPLANT
NS IRRIG 500ML POUR BTL (IV SOLUTION) ×3 IMPLANT
PACK CYSTO (CUSTOM PROCEDURE TRAY) ×3 IMPLANT
SHEATH URETERAL 12FRX35CM (MISCELLANEOUS) ×1 IMPLANT
STENT URET 6FRX24 CONTOUR (STENTS) ×2 IMPLANT
SYR 10ML LL (SYRINGE) ×3 IMPLANT
TRACTIP FLEXIVA PULS ID 200XHI (Laser) IMPLANT
TRACTIP FLEXIVA PULSE ID 200 (Laser) ×3
TUBE CONNECTING 12X1/4 (SUCTIONS) ×1 IMPLANT
TUBING UROLOGY SET (TUBING) ×3 IMPLANT

## 2021-12-05 NOTE — Anesthesia Preprocedure Evaluation (Signed)
Anesthesia Evaluation  Patient identified by MRN, date of birth, ID band Patient awake    Reviewed: Allergy & Precautions, NPO status , Patient's Chart, lab work & pertinent test results  History of Anesthesia Complications Negative for: history of anesthetic complications  Airway Mallampati: II  TM Distance: >3 FB Neck ROM: Full    Dental  (+) Dental Advisory Given   Pulmonary asthma ,    Pulmonary exam normal        Cardiovascular hypertension, +CHF  Normal cardiovascular exam   Echo 08/02/21: mild LVH, mild global hypokinesis (EF 45-50%), trace AR, mild-mod MR, mild TR   Neuro/Psych negative neurological ROS     GI/Hepatic Neg liver ROS, GERD  ,  Endo/Other  Hypothyroidism   Renal/GU Renal disease (b/l renal stones, Cr 1.30)  negative genitourinary   Musculoskeletal negative musculoskeletal ROS (+)   Abdominal   Peds  Hematology  (+) Blood dyscrasia, anemia , H/o breast cancer s/p mastectomy/chemo/XRT   Anesthesia Other Findings   Reproductive/Obstetrics                            Anesthesia Physical Anesthesia Plan  ASA: 3  Anesthesia Plan: General   Post-op Pain Management: Tylenol PO (pre-op)* and Minimal or no pain anticipated   Induction: Intravenous  PONV Risk Score and Plan: 3 and Ondansetron, Dexamethasone, Midazolam and Treatment may vary due to age or medical condition  Airway Management Planned: LMA  Additional Equipment: None  Intra-op Plan:   Post-operative Plan: Extubation in OR  Informed Consent: I have reviewed the patients History and Physical, chart, labs and discussed the procedure including the risks, benefits and alternatives for the proposed anesthesia with the patient or authorized representative who has indicated his/her understanding and acceptance.     Dental advisory given  Plan Discussed with:   Anesthesia Plan Comments:          Anesthesia Quick Evaluation

## 2021-12-05 NOTE — Anesthesia Postprocedure Evaluation (Signed)
Anesthesia Post Note  Patient: Katelyn Watson  Procedure(s) Performed: CYSTOSCOPY/URETEROSCOPY/HOLMIUM LASER/STENT PLACEMENT (Bilateral: Ureter) CYSTOSCOPY WITH RETROGRADE PYELOGRAM WITH INTRAOPERATIVE INTERPRETATION OF FLUOROSCOPIC IMAGING (Left: Ureter)     Patient location during evaluation: PACU Anesthesia Type: General Level of consciousness: awake and alert Pain management: pain level controlled Vital Signs Assessment: post-procedure vital signs reviewed and stable Respiratory status: spontaneous breathing, nonlabored ventilation and respiratory function stable Cardiovascular status: blood pressure returned to baseline and stable Postop Assessment: no apparent nausea or vomiting Anesthetic complications: no   No notable events documented.  Last Vitals:  Vitals:   12/05/21 1044  BP: 130/68  Pulse: 67  Resp: 18  Temp: 36.7 C  SpO2: 95%    Last Pain:  Vitals:   12/05/21 1044  TempSrc: Oral  PainSc: [place]

## 2021-12-05 NOTE — Discharge Instructions (Signed)

## 2021-12-05 NOTE — Transfer of Care (Signed)
Immediate Anesthesia Transfer of Care Note  Patient: Katelyn Watson  Procedure(s) Performed: CYSTOSCOPY/URETEROSCOPY/HOLMIUM LASER/STENT PLACEMENT (Bilateral: Ureter) CYSTOSCOPY WITH RETROGRADE PYELOGRAM WITH INTRAOPERATIVE INTERPRETATION OF FLUOROSCOPIC IMAGING (Left: Ureter)  Patient Location: PACU  Anesthesia Type:General  Level of Consciousness: awake, alert , oriented and patient cooperative  Airway & Oxygen Therapy: Patient Spontanous Breathing and Patient connected to nasal cannula oxygen  Post-op Assessment: Report given to RN and Post -op Vital signs reviewed and stable  Post vital signs: Reviewed and stable  Last Vitals:  Vitals Value Taken Time  BP 124/102 12/05/21 1415  Temp    Pulse 71 12/05/21 1419  Resp 17 12/05/21 1419  SpO2 91 % 12/05/21 1419  Vitals shown include unvalidated device data.  Last Pain:  Vitals:   12/05/21 1044  TempSrc: Oral  PainSc: 6       Patients Stated Pain Goal: 7 (50/27/74 1287)  Complications: No notable events documented.

## 2021-12-05 NOTE — Anesthesia Procedure Notes (Addendum)
Procedure Name: LMA Insertion Date/Time: 12/05/2021 12:38 PM  Performed by: Georgeanne Nim, CRNAPre-anesthesia Checklist: Patient identified, Emergency Drugs available, Suction available, Patient being monitored and Timeout performed Patient Re-evaluated:Patient Re-evaluated prior to induction Oxygen Delivery Method: Circle system utilized Preoxygenation: Pre-oxygenation with 100% oxygen Induction Type: IV induction Ventilation: Mask ventilation without difficulty LMA: LMA inserted LMA Size: 4.0 Number of attempts: 1 Placement Confirmation: positive ETCO2, CO2 detector and breath sounds checked- equal and bilateral Tube secured with: Tape Dental Injury: Teeth and Oropharynx as per pre-operative assessment

## 2021-12-05 NOTE — Op Note (Signed)
Operative Note  Preoperative diagnosis:  1.  Bilateral renal stones 2.  Possible colovesical fistula 3.  Recurrent UTIs  Postoperative diagnosis: 1.  Bilateral renal stones 2.  Possible colovesical fistula 3.  Recurrent UTIs  Procedure(s): 1.  Cystoscopy with bilateral ureteroscopy, holmium laser lithotripsy and bilateral ureteral stent placement 2.  Left retrograde pyelogram with intraoperative interpretation of fluoroscopic imaging  Surgeon: Ellison Hughs, MD  Assistants:  None  Anesthesia:  General  Complications:  None  EBL: Less than 5 mL  Specimens: 1.  Previously placed right ureteral stent was removed intact, inspected and discarded  Drains/Catheters: 1.  Bilateral 6 French, 24 cm JJ stents without tethers  Intraoperative findings:   Solitary left collecting system with no filling defects or dilation involving the left ureter or left renal pelvis seen on retrograde pyelogram Her bilateral renal stone burden f was fractured to 2 mm or less fragments with laser lithotripsy  Indication:  Katelyn Watson is a 76 y.o. 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  She is here today for repeat ureteroscopy to address her significant bilateral stone burden.  She has been consented for the above procedures, voices understanding and wishes to proceed.  Description of procedure:  After informed consent was obtained, the patient was brought to the operating room and general LMA anesthesia was administered. The patient was then placed in the dorsolithotomy position and prepped and draped in the usual sterile fashion. A timeout was performed. A 23 French rigid cystoscope was then inserted into the urethral meatus and advanced into the bladder under direct vision. A complete bladder survey revealed persistent mucosal edema and erythema involving the posterior bladder wall.  Her previously placed right ureteral stent was grasped at its  distal curl and retracted to the urethral meatus.  A Glidewire was then advanced through the lumen of the stent and up to the right renal pelvis, under fluoroscopic guidance.  The stent was then removed intact, inspected and discarded.  An additional sensor wire was then placed up the right ureter to the right renal pelvis.  A 12/14 French, 35 cm ureteral access sheath was then advanced over the sensor wire and into position within the proximal aspect of the right ureter.  A flexible ureteroscope was then advanced through the lumen of the access sheath and into the renal pelvis where numerous stone fragments were identified.  A 200 m holmium laser was then used to fracture the remaining stone fragments into 2 mm or less fragments.  The flexible ureteroscope and ureteral access sheath were then removed under direct vision, identifying no evidence of ureteral trauma or ureteral stone burden.  A new 6 [place], 24 cm JJ stent was then advanced over the Glidewire and into position within the right collecting system, confirming placement via fluoroscopy.  A 5 French ureteral catheter was then inserted into the left ureteral orifice and a retrograde pyelogram was obtained, with the findings listed above.  A Glidewire was then used to intubate the lumen of the ureteral catheter and was advanced up to the left renal pelvis, under fluoroscopic guidance.  The catheter was then removed, leaving the wire in place.  An additional sensor wire was then advanced up the left collecting system to the left renal pelvis, under fluoroscopic guidance.  A 12/14 French, 35 cm ureteral access sheath was then advanced over the sensor wire and into position within the proximal aspects of the left ureter.  Complete inspection of the  left renal pelvis identified two 5 to 6 mm stones in the midpole calyces.  The 200 m holmium laser was then used to fracture the stones into 2 mm or less fragments.    The flexible ureteroscope and ureteral  access sheath were then removed under direct vision.  There was a 1 cm area of mucosal disruption within the proximal aspects of the right ureter, with no other evidence of ureteral trauma.  A 6 French, 24 cm JJ stent was then advanced over the Glidewire and into good position within the left collecting system, confirming placement via fluoroscopy.  The patient's bladder was drained.  She tolerated the procedure well and was transferred to the postanesthesia in stable condition.  Plan: Follow-up in 1 week for KUB and likely office cystoscopy and stent removal.  She will remain on prophylactic Keflex until then.

## 2021-12-05 NOTE — H&P (Signed)
Urology Preoperative H&P   Chief Complaint: Bilateral renal stones, possible colovesical fistula  History of Present Illness: Katelyn Watson is a 76 y.o. 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  She is back today for another ureteroscopy to address her significant stone burden.  She denies interval episodes of flank pain, fever/chills, but does report pelvic and lower abdominal pressure and notes gas passing with urination.  Past Medical History:  Diagnosis Date   Acquired trigger finger of left ring finger 03/03/2018   Anemia    hx of    Arthritis    Asthma 08/02/2016   Breast cancer ([place])    treated with masectomy, chemo, and xrt   Degeneration of lumbar intervertebral disc 09/12/2017   Depression    DIASTOLIC DYSFUNCTION 19/50/9326   Qualifier: Diagnosis of  By: Gwenette Greet MD, Armando Reichert    Family history of anesthesia complication    brother- became stiff after sinus surgery    GERD (gastroesophageal reflux disease)    H/O Hashimoto thyroiditis 09/12/2016   History of bilateral breast cancer 08/09/2021   History of kidney stones    HLD (hyperlipidemia)    HYPERTENSION 11/28/2008   Qualifier: Diagnosis of  By: Doy Mince LPN, Megan     Hypothyroidism    Laryngospasms 01/14/2018   Morbid obesity ([place]) 01/19/2014   Non-healing wound of upper extremity 08/09/2021   Pure hypercholesterolemia 07/19/2021   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   Shortness of breath    slight with exertion    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    Past Surgical History:  Procedure Laterality Date   ABDOMINAL HYSTERECTOMY  2004   CARDIAC CATHETERIZATION     CYSTOSCOPY/URETEROSCOPY/HOLMIUM LASER/STENT PLACEMENT Bilateral 11/27/2021   Procedure: CYSTOSCOPY/RETROGRADE/URETEROSCOPY/HOLMIUM  LASER/ RIGHTSTENT PLACEMENT;  Surgeon: Ceasar Mons, MD;  Location: [facility];  Service: Urology;  Laterality: Bilateral;   DILATION AND CURETTAGE OF UTERUS  06/03/2000   MASTECTOMY Left 06/03/1997   MASTECTOMY Right 08/2016   TONSILLECTOMY  1951   and adenoidectomy    TOTAL KNEE ARTHROPLASTY Left 01/17/2014   Procedure: LEFT TOTAL KNEE ARTHROPLASTY;  Surgeon: Mauri Pole, MD;  Location: [facility];  Service: Orthopedics;  Laterality: Left;   TRIGGER FINGER RELEASE Bilateral    2018 Right 4th finger; 2019 Left 4th finger release   VARICOSE VEIN SURGERY     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    Allergies:  Allergies  Allergen Reactions   Montelukast Sodium     Causes weakness  Other reaction(s): weakness, fatigue   Latex Rash   Tape Rash    Family History  Problem Relation Age of Onset   Hypertension Mother    Heart attack Mother    Colon cancer Mother    Endometrial cancer Mother    Hypertension Father    Atrial fibrillation Father    Heart disease Father    Diabetes Brother    Lung cancer Brother    Heart attack Brother    Hypertension Brother    Bladder Cancer Brother    Heart disease Other        father and brother (afib); mother (MI)   Cancer Other        mother (colon); brother (melanoma, lung)    Social History:  reports that she has  never smoked. She has been exposed to tobacco smoke. She has never used smokeless tobacco. She reports that she does not drink alcohol and does not use drugs.  ROS: A complete review of systems was performed.  All systems are negative except for pertinent findings as noted.  Physical Exam:  Vital signs in last 24 hours: Temp:  [98 F (36.7 C)] 98 F (36.7 C) (07/05 1044) Pulse Rate:  [67] 67 (07/05 1044) Resp:  [18] 18 (07/05 1044) BP: (130)/(68) 130/68 (07/05 1044) SpO2:  [95 %] 95 % (07/05 1044) Weight:  [90.4 kg] 90.4 kg (07/05 1044) Constitutional:  Alert and oriented, No  acute distress Cardiovascular: Regular rate and rhythm, No JVD Respiratory: Normal respiratory effort, Lungs clear bilaterally GI: Abdomen is soft, nontender, nondistended, no abdominal masses GU: No CVA tenderness Lymphatic: No lymphadenopathy Neurologic: Grossly intact, no focal deficits Psychiatric: Normal mood and affect  Laboratory Data:  Recent Labs    12/05/21 1105  HGB 10.9*  HCT 32.0*    Recent Labs    12/05/21 1105  NA 141  K 3.7  CL 103  GLUCOSE 103*  BUN 12  CREATININE 1.30*     Results for orders placed or performed during the hospital encounter of 12/05/21 (from the past 24 hour(s))  I-STAT, chem 8     Status: Abnormal   Collection Time: 12/05/21 11:05 AM  Result Value Ref Range   Sodium 141 135 - 145 mmol/L   Potassium 3.7 3.5 - 5.1 mmol/L   Chloride 103 98 - 111 mmol/L   BUN 12 8 - 23 mg/dL   Creatinine, Ser 1.30 (H) 0.44 - 1.00 mg/dL   Glucose, Bld 103 (H) 70 - 99 mg/dL   Calcium, Ion 1.25 1.15 - 1.40 mmol/L   TCO2 25 22 - 32 mmol/L   Hemoglobin 10.9 (L) 12.0 - 15.0 g/dL   HCT 32.0 (L) 36.0 - 46.0 %   No results found for this or any previous visit (from the past 240 hour(s)).  Renal Function: Recent Labs    12/05/21 1105  CREATININE 1.30*   Estimated Creatinine Clearance: 39.9 mL/min (A) (by C-G formula based on SCr of 1.3 mg/dL (H)).  Radiologic Imaging: No results found.  I independently reviewed the above imaging studies.  Assessment and Plan Katelyn Watson is a 76 y.o. 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  Risks included, but are not limited to: bleeding, urinary tract infection, ureteral injury/avulsion, ureteral stricture formation, retained stone fragments, the possibility that multiple surgeries may be required to treat  the stone(s), MI, stroke, PE and the inherent risks of general anesthesia.  The patient voices understanding and wishes to proceed.      Ellison Hughs, MD 12/05/2021, 11:47 AM  [facility] Pager: [phone]

## 2021-12-06 ENCOUNTER — Encounter (HOSPITAL_BASED_OUTPATIENT_CLINIC_OR_DEPARTMENT_OTHER): Payer: Self-pay | Admitting: Urology

## 2021-12-12 DIAGNOSIS — K6389 Other specified diseases of intestine: Secondary | ICD-10-CM | POA: Diagnosis not present

## 2021-12-12 DIAGNOSIS — K7689 Other specified diseases of liver: Secondary | ICD-10-CM | POA: Diagnosis not present

## 2021-12-12 DIAGNOSIS — K5730 Diverticulosis of large intestine without perforation or abscess without bleeding: Secondary | ICD-10-CM | POA: Diagnosis not present

## 2021-12-12 DIAGNOSIS — N3000 Acute cystitis without hematuria: Secondary | ICD-10-CM | POA: Diagnosis not present

## 2021-12-12 DIAGNOSIS — N200 Calculus of kidney: Secondary | ICD-10-CM | POA: Diagnosis not present

## 2021-12-17 DIAGNOSIS — N200 Calculus of kidney: Secondary | ICD-10-CM | POA: Diagnosis not present

## 2021-12-24 ENCOUNTER — Telehealth: Payer: Self-pay

## 2021-12-24 ENCOUNTER — Telehealth: Payer: Self-pay | Admitting: Cardiology

## 2021-12-24 DIAGNOSIS — N390 Urinary tract infection, site not specified: Secondary | ICD-10-CM | POA: Diagnosis not present

## 2021-12-24 DIAGNOSIS — R5383 Other fatigue: Secondary | ICD-10-CM | POA: Diagnosis not present

## 2021-12-24 DIAGNOSIS — R531 Weakness: Secondary | ICD-10-CM | POA: Diagnosis not present

## 2021-12-24 DIAGNOSIS — E860 Dehydration: Secondary | ICD-10-CM | POA: Diagnosis not present

## 2021-12-24 NOTE — Telephone Encounter (Signed)
Called patient and she reported that she had undergone lithotripsy to remove kidney stones about a week ago and since then she has felt very weak. The nephrologist thought that she needed to see a general surgeon regarding a fistula. The patient stated that a consult was placed but she has not heard anything as of today. She has continued to get weak and fell this past Saturday. She reports no fever at this time. I spoke with Dr. Bettina Gavia regarding the patient's concerns and he said to have her follow up with her PCP. I informed the patient of this and she had no further questions at this time.

## 2021-12-24 NOTE — Telephone Encounter (Signed)
Relative states that pt has been very weak for the last month, to the point that she is afraid to leave her alone. Pt would like for nurse to callback. Please advise

## 2022-01-10 ENCOUNTER — Other Ambulatory Visit: Payer: Self-pay

## 2022-01-10 DIAGNOSIS — R5383 Other fatigue: Secondary | ICD-10-CM | POA: Diagnosis not present

## 2022-01-10 DIAGNOSIS — E440 Moderate protein-calorie malnutrition: Secondary | ICD-10-CM | POA: Diagnosis not present

## 2022-01-10 DIAGNOSIS — N200 Calculus of kidney: Secondary | ICD-10-CM | POA: Diagnosis not present

## 2022-01-10 DIAGNOSIS — Z6831 Body mass index (BMI) 31.0-31.9, adult: Secondary | ICD-10-CM | POA: Diagnosis not present

## 2022-01-10 DIAGNOSIS — Z79899 Other long term (current) drug therapy: Secondary | ICD-10-CM | POA: Diagnosis not present

## 2022-01-10 DIAGNOSIS — R531 Weakness: Secondary | ICD-10-CM | POA: Diagnosis not present

## 2022-01-10 DIAGNOSIS — I110 Hypertensive heart disease with heart failure: Secondary | ICD-10-CM | POA: Diagnosis not present

## 2022-01-10 DIAGNOSIS — R630 Anorexia: Secondary | ICD-10-CM | POA: Diagnosis not present

## 2022-01-10 DIAGNOSIS — D649 Anemia, unspecified: Secondary | ICD-10-CM | POA: Diagnosis not present

## 2022-01-10 NOTE — Patient Outreach (Signed)
  Care Coordination   Outreach  Visit Note   01/10/2022 Name: Katelyn Watson MRN: [id number] DOB: [birth year]  Katelyn Watson is a 76 y.o. year old female who sees Prochnau, Chrys Racer, MD for primary care. I spoke with  Katelyn Watson by phone today  What matters to the patients health and wellness today?  Placed call to patient to explain and offer [facility] care coordination program. Patient reports she is on her way to MD office and can not talk. I offered to call back tomorrow and she agreed   SDOH assessments and interventions completed:  No     Care Coordination Interventions Activated:  No  Care Coordination Interventions:  No, not indicated   Follow up plan: will call back  Encounter Outcome:  Pt. Request to Call Back  Tomasa Rand, RN, BSN, CEN [place] Coordinator [phone]

## 2022-01-11 ENCOUNTER — Other Ambulatory Visit: Payer: Self-pay | Admitting: Cardiology

## 2022-01-15 ENCOUNTER — Telehealth: Payer: Self-pay | Admitting: Cardiology

## 2022-01-15 DIAGNOSIS — D509 Iron deficiency anemia, unspecified: Secondary | ICD-10-CM | POA: Diagnosis not present

## 2022-01-15 DIAGNOSIS — E063 Autoimmune thyroiditis: Secondary | ICD-10-CM | POA: Diagnosis not present

## 2022-01-15 DIAGNOSIS — E440 Moderate protein-calorie malnutrition: Secondary | ICD-10-CM | POA: Diagnosis not present

## 2022-01-15 DIAGNOSIS — K5730 Diverticulosis of large intestine without perforation or abscess without bleeding: Secondary | ICD-10-CM | POA: Diagnosis not present

## 2022-01-15 DIAGNOSIS — R161 Splenomegaly, not elsewhere classified: Secondary | ICD-10-CM | POA: Diagnosis not present

## 2022-01-15 DIAGNOSIS — N200 Calculus of kidney: Secondary | ICD-10-CM | POA: Diagnosis not present

## 2022-01-15 DIAGNOSIS — M519 Unspecified thoracic, thoracolumbar and lumbosacral intervertebral disc disorder: Secondary | ICD-10-CM | POA: Diagnosis not present

## 2022-01-15 DIAGNOSIS — L719 Rosacea, unspecified: Secondary | ICD-10-CM | POA: Diagnosis not present

## 2022-01-15 DIAGNOSIS — Z79891 Long term (current) use of opiate analgesic: Secondary | ICD-10-CM | POA: Diagnosis not present

## 2022-01-15 DIAGNOSIS — I8390 Asymptomatic varicose veins of unspecified lower extremity: Secondary | ICD-10-CM | POA: Diagnosis not present

## 2022-01-15 DIAGNOSIS — K219 Gastro-esophageal reflux disease without esophagitis: Secondary | ICD-10-CM | POA: Diagnosis not present

## 2022-01-15 DIAGNOSIS — I429 Cardiomyopathy, unspecified: Secondary | ICD-10-CM | POA: Diagnosis not present

## 2022-01-15 DIAGNOSIS — I110 Hypertensive heart disease with heart failure: Secondary | ICD-10-CM | POA: Diagnosis not present

## 2022-01-15 DIAGNOSIS — M8580 Other specified disorders of bone density and structure, unspecified site: Secondary | ICD-10-CM | POA: Diagnosis not present

## 2022-01-15 DIAGNOSIS — E041 Nontoxic single thyroid nodule: Secondary | ICD-10-CM | POA: Diagnosis not present

## 2022-01-15 DIAGNOSIS — R630 Anorexia: Secondary | ICD-10-CM | POA: Diagnosis not present

## 2022-01-15 DIAGNOSIS — I5032 Chronic diastolic (congestive) heart failure: Secondary | ICD-10-CM | POA: Diagnosis not present

## 2022-01-15 DIAGNOSIS — E785 Hyperlipidemia, unspecified: Secondary | ICD-10-CM | POA: Diagnosis not present

## 2022-01-15 DIAGNOSIS — F32A Depression, unspecified: Secondary | ICD-10-CM | POA: Diagnosis not present

## 2022-01-15 DIAGNOSIS — G4700 Insomnia, unspecified: Secondary | ICD-10-CM | POA: Diagnosis not present

## 2022-01-15 DIAGNOSIS — I700 Atherosclerosis of aorta: Secondary | ICD-10-CM | POA: Diagnosis not present

## 2022-01-15 DIAGNOSIS — E039 Hypothyroidism, unspecified: Secondary | ICD-10-CM | POA: Diagnosis not present

## 2022-01-15 DIAGNOSIS — H409 Unspecified glaucoma: Secondary | ICD-10-CM | POA: Diagnosis not present

## 2022-01-15 DIAGNOSIS — J45909 Unspecified asthma, uncomplicated: Secondary | ICD-10-CM | POA: Diagnosis not present

## 2022-01-15 DIAGNOSIS — M1990 Unspecified osteoarthritis, unspecified site: Secondary | ICD-10-CM | POA: Diagnosis not present

## 2022-01-15 NOTE — Telephone Encounter (Signed)
Called and left a message for Elmyra Ricks from [facility] to call back.

## 2022-01-15 NOTE — Telephone Encounter (Signed)
New message:     Elmyra Ricks from [facility]  called. She would like to go over current list of patient's medication.

## 2022-01-16 ENCOUNTER — Other Ambulatory Visit: Payer: Self-pay

## 2022-01-16 DIAGNOSIS — I8390 Asymptomatic varicose veins of unspecified lower extremity: Secondary | ICD-10-CM | POA: Diagnosis not present

## 2022-01-16 DIAGNOSIS — I5032 Chronic diastolic (congestive) heart failure: Secondary | ICD-10-CM | POA: Diagnosis not present

## 2022-01-16 DIAGNOSIS — I429 Cardiomyopathy, unspecified: Secondary | ICD-10-CM | POA: Diagnosis not present

## 2022-01-16 DIAGNOSIS — I110 Hypertensive heart disease with heart failure: Secondary | ICD-10-CM | POA: Diagnosis not present

## 2022-01-16 DIAGNOSIS — D509 Iron deficiency anemia, unspecified: Secondary | ICD-10-CM | POA: Diagnosis not present

## 2022-01-16 DIAGNOSIS — J45909 Unspecified asthma, uncomplicated: Secondary | ICD-10-CM | POA: Diagnosis not present

## 2022-01-16 NOTE — Telephone Encounter (Signed)
Called Elmyra Ricks from [facility] and reconciled the patient's cardiac medications. Elmyra Ricks stated that the patients resting blood pressure was 90/50 but when she got ready to leave she checked the patient's blood pressure again and it had "come up". She said that she was going to have the patient start checking her blood pressure daily and recording them. Elmyra Ricks had no further questions at this time.

## 2022-01-17 ENCOUNTER — Ambulatory Visit: Payer: Medicare Other | Admitting: Podiatry

## 2022-01-17 DIAGNOSIS — D509 Iron deficiency anemia, unspecified: Secondary | ICD-10-CM | POA: Diagnosis not present

## 2022-01-18 DIAGNOSIS — J45909 Unspecified asthma, uncomplicated: Secondary | ICD-10-CM | POA: Diagnosis not present

## 2022-01-18 DIAGNOSIS — I8390 Asymptomatic varicose veins of unspecified lower extremity: Secondary | ICD-10-CM | POA: Diagnosis not present

## 2022-01-18 DIAGNOSIS — D509 Iron deficiency anemia, unspecified: Secondary | ICD-10-CM | POA: Diagnosis not present

## 2022-01-18 DIAGNOSIS — I429 Cardiomyopathy, unspecified: Secondary | ICD-10-CM | POA: Diagnosis not present

## 2022-01-18 DIAGNOSIS — I5032 Chronic diastolic (congestive) heart failure: Secondary | ICD-10-CM | POA: Diagnosis not present

## 2022-01-18 DIAGNOSIS — I110 Hypertensive heart disease with heart failure: Secondary | ICD-10-CM | POA: Diagnosis not present

## 2022-01-21 DIAGNOSIS — I8390 Asymptomatic varicose veins of unspecified lower extremity: Secondary | ICD-10-CM | POA: Diagnosis not present

## 2022-01-21 DIAGNOSIS — I5032 Chronic diastolic (congestive) heart failure: Secondary | ICD-10-CM | POA: Diagnosis not present

## 2022-01-21 DIAGNOSIS — I429 Cardiomyopathy, unspecified: Secondary | ICD-10-CM | POA: Diagnosis not present

## 2022-01-21 DIAGNOSIS — D509 Iron deficiency anemia, unspecified: Secondary | ICD-10-CM | POA: Diagnosis not present

## 2022-01-21 DIAGNOSIS — I110 Hypertensive heart disease with heart failure: Secondary | ICD-10-CM | POA: Diagnosis not present

## 2022-01-21 DIAGNOSIS — J45909 Unspecified asthma, uncomplicated: Secondary | ICD-10-CM | POA: Diagnosis not present

## 2022-01-22 DIAGNOSIS — N321 Vesicointestinal fistula: Secondary | ICD-10-CM | POA: Diagnosis not present

## 2022-01-22 DIAGNOSIS — K5730 Diverticulosis of large intestine without perforation or abscess without bleeding: Secondary | ICD-10-CM | POA: Diagnosis not present

## 2022-01-23 DIAGNOSIS — I5032 Chronic diastolic (congestive) heart failure: Secondary | ICD-10-CM | POA: Diagnosis not present

## 2022-01-23 DIAGNOSIS — J45909 Unspecified asthma, uncomplicated: Secondary | ICD-10-CM | POA: Diagnosis not present

## 2022-01-23 DIAGNOSIS — D509 Iron deficiency anemia, unspecified: Secondary | ICD-10-CM | POA: Diagnosis not present

## 2022-01-23 DIAGNOSIS — I8390 Asymptomatic varicose veins of unspecified lower extremity: Secondary | ICD-10-CM | POA: Diagnosis not present

## 2022-01-23 DIAGNOSIS — I429 Cardiomyopathy, unspecified: Secondary | ICD-10-CM | POA: Diagnosis not present

## 2022-01-23 DIAGNOSIS — I110 Hypertensive heart disease with heart failure: Secondary | ICD-10-CM | POA: Diagnosis not present

## 2022-01-24 DIAGNOSIS — D509 Iron deficiency anemia, unspecified: Secondary | ICD-10-CM | POA: Diagnosis not present

## 2022-01-25 DIAGNOSIS — J45909 Unspecified asthma, uncomplicated: Secondary | ICD-10-CM | POA: Diagnosis not present

## 2022-01-25 DIAGNOSIS — D509 Iron deficiency anemia, unspecified: Secondary | ICD-10-CM | POA: Diagnosis not present

## 2022-01-25 DIAGNOSIS — I110 Hypertensive heart disease with heart failure: Secondary | ICD-10-CM | POA: Diagnosis not present

## 2022-01-25 DIAGNOSIS — I8390 Asymptomatic varicose veins of unspecified lower extremity: Secondary | ICD-10-CM | POA: Diagnosis not present

## 2022-01-25 DIAGNOSIS — I5032 Chronic diastolic (congestive) heart failure: Secondary | ICD-10-CM | POA: Diagnosis not present

## 2022-01-25 DIAGNOSIS — I429 Cardiomyopathy, unspecified: Secondary | ICD-10-CM | POA: Diagnosis not present

## 2022-01-28 DIAGNOSIS — D509 Iron deficiency anemia, unspecified: Secondary | ICD-10-CM | POA: Diagnosis not present

## 2022-01-28 DIAGNOSIS — I429 Cardiomyopathy, unspecified: Secondary | ICD-10-CM | POA: Diagnosis not present

## 2022-01-28 DIAGNOSIS — I8390 Asymptomatic varicose veins of unspecified lower extremity: Secondary | ICD-10-CM | POA: Diagnosis not present

## 2022-01-28 DIAGNOSIS — I5032 Chronic diastolic (congestive) heart failure: Secondary | ICD-10-CM | POA: Diagnosis not present

## 2022-01-28 DIAGNOSIS — I110 Hypertensive heart disease with heart failure: Secondary | ICD-10-CM | POA: Diagnosis not present

## 2022-01-28 DIAGNOSIS — J45909 Unspecified asthma, uncomplicated: Secondary | ICD-10-CM | POA: Diagnosis not present

## 2022-01-29 ENCOUNTER — Telehealth: Payer: Self-pay | Admitting: Cardiology

## 2022-01-29 ENCOUNTER — Telehealth: Payer: Self-pay

## 2022-01-29 DIAGNOSIS — I5032 Chronic diastolic (congestive) heart failure: Secondary | ICD-10-CM | POA: Diagnosis not present

## 2022-01-29 DIAGNOSIS — E440 Moderate protein-calorie malnutrition: Secondary | ICD-10-CM | POA: Diagnosis not present

## 2022-01-29 DIAGNOSIS — J45909 Unspecified asthma, uncomplicated: Secondary | ICD-10-CM | POA: Diagnosis not present

## 2022-01-29 DIAGNOSIS — I429 Cardiomyopathy, unspecified: Secondary | ICD-10-CM | POA: Diagnosis not present

## 2022-01-29 DIAGNOSIS — D509 Iron deficiency anemia, unspecified: Secondary | ICD-10-CM | POA: Diagnosis not present

## 2022-01-29 DIAGNOSIS — I110 Hypertensive heart disease with heart failure: Secondary | ICD-10-CM | POA: Diagnosis not present

## 2022-01-29 DIAGNOSIS — I8390 Asymptomatic varicose veins of unspecified lower extremity: Secondary | ICD-10-CM | POA: Diagnosis not present

## 2022-01-29 DIAGNOSIS — N200 Calculus of kidney: Secondary | ICD-10-CM | POA: Diagnosis not present

## 2022-01-29 NOTE — Telephone Encounter (Signed)
   Name: ANDEE CHIVERS  DOB: 1945-07-18  MRN: [id number]  Primary Cardiologist: None   Preoperative team, please contact this patient and set up a phone call appointment for further preoperative risk assessment. Please obtain consent and complete medication review. Thank you for your help.  I confirm that guidance regarding antiplatelet and oral anticoagulation therapy has been completed and, if necessary, noted below. -No request to hold meds  Richardson Dopp, PA-C 01/29/2022, 5:26 PM [place]

## 2022-01-29 NOTE — Telephone Encounter (Signed)
   Pre-operative Risk Assessment    Patient Name: Katelyn Watson  DOB: 08/13/1945 MRN: [id number]      Request for Surgical Clearance    Procedure:   Colonoscopy   Date of Surgery:  Clearance 01/31/22                                 Surgeon:  Dr. Noberto Retort  Surgeon's Group or Practice Name:  [facility] in [place]   Phone number:  [phone] Fax number:  [phone]   Type of Clearance Requested:   Medical    Type of Anesthesia:   Propofol    Additional requests/questions:   she said they faxed it over last Monday   Dorthey Sawyer   01/29/2022, 4:33 PM

## 2022-01-29 NOTE — Telephone Encounter (Signed)
Received a call from Butch Penny at Dr. Mauro Kaufmann office asking for a favor to provide cardiac clearance for a colonoscopy to evaluate a intestinal - bladder fistula in preparation for surgery. The patient's colonoscopy is scheduled for Thursday. She stated that their office had faxed over multiple forms but our front office stated that they hadn't received them. I explained that I would let Dr. Bettina Gavia know about this tomorrow when he returns and she was agreeable with this plan. She also asked if we could fax over the last office visit note when Dr. Bettina Gavia saw the patient and that was office visit note was faxed.

## 2022-01-30 ENCOUNTER — Ambulatory Visit: Payer: Medicare Other | Attending: Physician Assistant | Admitting: Physician Assistant

## 2022-01-30 ENCOUNTER — Encounter: Payer: Self-pay | Admitting: Physician Assistant

## 2022-01-30 ENCOUNTER — Telehealth: Payer: Self-pay | Admitting: Cardiology

## 2022-01-30 ENCOUNTER — Telehealth: Payer: Self-pay | Admitting: *Deleted

## 2022-01-30 DIAGNOSIS — Z01810 Encounter for preprocedural cardiovascular examination: Secondary | ICD-10-CM | POA: Diagnosis not present

## 2022-01-30 NOTE — Telephone Encounter (Signed)
Pt has been scheduled for a tele visit, today, 01/30/22, 11:00.  Consent on file.  Pt couldn't get to her medications and wasn't anyone available to assist her at this time.  She will try to call back before 11:00 call.    Patient Consent for Virtual Visit        Katelyn Watson has provided verbal consent on 01/30/2022 for a virtual visit (video or telephone).   CONSENT FOR VIRTUAL VISIT FOR:  Katelyn Watson  By participating in this virtual visit I agree to the following:  I hereby voluntarily request, consent and authorize [facility] and its employed or contracted physicians, physician assistants, nurse practitioners or other licensed health care professionals (the Practitioner), to provide me with telemedicine health care services (the "Services") as deemed necessary by the treating Practitioner. I acknowledge and consent to receive the Services by the Practitioner via telemedicine. I understand that the telemedicine visit will involve communicating with the Practitioner through live audiovisual communication technology and the disclosure of certain medical information by electronic transmission. I acknowledge that I have been given the opportunity to request an in-person assessment or other available alternative prior to the telemedicine visit and am voluntarily participating in the telemedicine visit.  I understand that I have the right to withhold or withdraw my consent to the use of telemedicine in the course of my care at any time, without affecting my right to future care or treatment, and that the Practitioner or I may terminate the telemedicine visit at any time. I understand that I have the right to inspect all information obtained and/or recorded in the course of the telemedicine visit and may receive copies of available information for a reasonable fee.  I understand that some of the potential risks of receiving the Services via telemedicine include:  Delay or interruption in  medical evaluation due to technological equipment failure or disruption; Information transmitted may not be sufficient (e.g. poor resolution of images) to allow for appropriate medical decision making by the Practitioner; and/or  In rare instances, security protocols could fail, causing a breach of personal health information.  Furthermore, I acknowledge that it is my responsibility to provide information about my medical history, conditions and care that is complete and accurate to the best of my ability. I acknowledge that Practitioner's advice, recommendations, and/or decision may be based on factors not within their control, such as incomplete or inaccurate data provided by me or distortions of diagnostic images or specimens that may result from electronic transmissions. I understand that the practice of medicine is not an exact science and that Practitioner makes no warranties or guarantees regarding treatment outcomes. I acknowledge that a copy of this consent can be made available to me via my patient portal ([facility]), or I can request a printed copy by calling the office of [facility].    I understand that my insurance will be billed for this visit.   I have read or had this consent read to me. I understand the contents of this consent, which adequately explains the benefits and risks of the Services being provided via telemedicine.  I have been provided ample opportunity to ask questions regarding this consent and the Services and have had my questions answered to my satisfaction. I give my informed consent for the services to be provided through the use of telemedicine in my medical care

## 2022-01-30 NOTE — Telephone Encounter (Signed)
Returned call and they were on the phone with Richardson Dopp, PA-C for the preop tele appointment. They did thank me for calling back.

## 2022-01-30 NOTE — Progress Notes (Signed)
Virtual Visit via Telephone Note   Because of Katelyn Watson's co-morbid illnesses, she is at least at moderate risk for complications without adequate follow up.  This format is felt to be most appropriate for this patient at this time.  The patient did not have access to video technology/had technical difficulties with video requiring transitioning to audio format only (telephone).  All issues noted in this document were discussed and addressed.  No physical exam could be performed with this format.  Please refer to the patient's chart for her consent to telehealth for [facility].  Evaluation Performed:  Preoperative cardiovascular risk assessment _____________   Date:  01/30/2022   Patient ID:  Katelyn Watson, DOB 12-22-1945, MRN [id number] Patient Location:  Home Provider location:   Office  Primary Care Provider:  Ernestene Kiel, MD Primary Cardiologist:  Shirlee More, MD  Chief Complaint / Patient Profile   76 y.o. y/o Katelyn with a h/o  HFmrEF (heart failure with mildly reduced ejection fraction)  Echo 08/2021: EF 45-50, mild LVH, mild global HK, diastolic dysfunction, AV sclerosis, trace AI, mild to moderate MR, mild TR, RVSP 29 Mitral regurgitation Hypertension  Chronic kidney disease  Hyperlipidemia  Anemia Breast CA s/p chemo, XR, mastectomy Hypothyroidism   who is pending  Procedure:   Colonoscopy  Date of Surgery:  Clearance 01/31/22                              Surgeon:  Dr. Noberto Retort  Surgeon's Group or Practice Name:  [facility] in [place]   Phone number:  [phone] Fax number:  [phone] Type of Clearance Requested:   Medical  Type of Anesthesia:   Propofol   She presents today for telephonic preoperative cardiovascular risk assessment.  Past Medical History    Past Medical History:  Diagnosis Date   Acquired trigger finger of left ring finger 03/03/2018   Anemia    hx of    Arthritis    Asthma 08/02/2016    Breast cancer ([place])    treated with masectomy, chemo, and xrt   Degeneration of lumbar intervertebral disc 09/12/2017   Depression    DIASTOLIC DYSFUNCTION 10/93/2355   Qualifier: Diagnosis of  By: Gwenette Greet MD, Armando Reichert    Family history of anesthesia complication    brother- became stiff after sinus surgery    GERD (gastroesophageal reflux disease)    H/O Hashimoto thyroiditis 09/12/2016   History of bilateral breast cancer 08/09/2021   History of kidney stones    HLD (hyperlipidemia)    HYPERTENSION 11/28/2008   Qualifier: Diagnosis of  By: Doy Mince LPN, Megan     Hypothyroidism    Laryngospasms 01/14/2018   Morbid obesity ([place]) 01/19/2014   Non-healing wound of upper extremity 08/09/2021   Pure hypercholesterolemia 07/19/2021   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   Shortness of breath    slight with exertion    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   Past Surgical History:  Procedure Laterality Date   ABDOMINAL HYSTERECTOMY  2004   CARDIAC CATHETERIZATION     CYSTOSCOPY W/ RETROGRADES Left 12/05/2021   Procedure: CYSTOSCOPY WITH RETROGRADE PYELOGRAM WITH INTRAOPERATIVE INTERPRETATION OF FLUOROSCOPIC IMAGING;  Surgeon: Ceasar Mons, MD;  Location: [facility];  Service: Urology;  Laterality: Left;   CYSTOSCOPY/URETEROSCOPY/HOLMIUM LASER/STENT PLACEMENT Bilateral 11/27/2021   Procedure: CYSTOSCOPY/RETROGRADE/URETEROSCOPY/HOLMIUM LASER/ RIGHTSTENT PLACEMENT;  Surgeon: Ceasar Mons, MD;  Location: [facility];  Service: Urology;  Laterality: Bilateral;   CYSTOSCOPY/URETEROSCOPY/HOLMIUM LASER/STENT PLACEMENT Bilateral 12/05/2021   Procedure: CYSTOSCOPY/URETEROSCOPY/HOLMIUM LASER/STENT PLACEMENT;  Surgeon: Ceasar Mons, MD;  [facility];  Service: Urology;  Laterality: Bilateral;   DILATION AND CURETTAGE OF UTERUS  06/03/2000   MASTECTOMY Left 06/03/1997   MASTECTOMY Right 08/2016   TONSILLECTOMY  1951   and  adenoidectomy    TOTAL KNEE ARTHROPLASTY Left 01/17/2014   Procedure: LEFT TOTAL KNEE ARTHROPLASTY;  Surgeon: Mauri Pole, MD;  Location: [facility];  Service: Orthopedics;  Laterality: Left;   TRIGGER FINGER RELEASE Bilateral    2018 Right 4th finger; 2019 Left 4th finger release   VARICOSE VEIN SURGERY     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 Katelyn who presents via audio/video conferencing for a telehealth visit today.  Pt was last seen in cardiology clinic on 6.16.23 by Dr. Bettina Gavia.  At that time Katelyn Watson was doing well.  The patient is now pending procedure as outlined above. Since her last visit, she has had a lot of ongoing issues. She is weak and asked me to discuss with her POA, Katelyn Watson (DPR on file). 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 She has had a lot of GI issues. She has lost 20+ lbs. She has needed Fe infusions. Her Lasix and Spironolactone were DC'd and her Coreg was reduced b/c of low BP. She has not had chest pain, syncope. She sleeps on an incline. She notes shortness of breath with activity. She also describes significant exhaustion.    Home Medications    Current Meds  Medication Sig   atorvastatin (LIPITOR) 10 MG tablet Take 10 mg by mouth daily.   carvedilol (COREG) 12.5 MG tablet Take 6.25 mg by mouth 2 (two) times daily with a meal.   hyoscyamine (ANASPAZ) 0.125 MG TBDP disintergrating tablet Take 0.125 mg by mouth every 4 (four) hours as needed for cramping.   latanoprost (XALATAN) 0.005 % ophthalmic solution Place 1 drop into both eyes at bedtime.   levothyroxine (SYNTHROID) 125 MCG tablet Take 125 mcg by mouth every morning.   mirtazapine (REMERON) 15 MG tablet Take 15 mg by mouth at  bedtime.   nystatin cream (MYCOSTATIN) Apply 1 application  topically 2 (two) times daily as needed for dry skin (yeast).   omeprazole (PRILOSEC) 20 MG capsule Take 20 mg by mouth 2 (two) times daily before a meal.   oxybutynin (DITROPAN) 5 MG tablet Take 1 tablet (5 mg total) by mouth every 8 (eight) hours as needed for bladder spasms.   oxyCODONE-acetaminophen (PERCOCET) 5-325 MG tablet Take 1 tablet by mouth every 4 (four) hours as needed for severe pain.   phenazopyridine (PYRIDIUM) 200 MG tablet Take 1 tablet (200 mg total) by mouth 3 (three) times daily as needed (for pain with urination).   Polyethyl Glycol-Propyl Glycol (SYSTANE) 0.4-0.3 % SOLN Place 1-2 drops into both eyes daily as needed (dry eyes).   potassium chloride (KLOR-CON) 10 MEQ tablet Take 10 mEq by mouth 2 (two) times daily.   promethazine (PHENERGAN) 25 MG tablet Take 25 mg by mouth 2 (two) times daily.   sacubitril-valsartan (ENTRESTO) 24-26  MG Take 1 tablet by mouth 2 (two) times daily.   sertraline (ZOLOFT) 100 MG tablet Take 100 mg by mouth daily.   traZODone (DESYREL) 150 MG tablet Take 150 mg by mouth at bedtime as needed for sleep.     Physical Exam    Vital Signs:  Katelyn Watson does not have vital signs available for review today.  Given telephonic nature of communication, physical exam is limited. AAOx3. NAD. Normal affect.  Speech and respirations are unlabored.  Accessory Clinical Findings    None  Assessment & Plan    1.  Preoperative Cardiovascular Risk Assessment:    Katelyn Watson perioperative risk of a major cardiac event is 0.9% according to the Revised Cardiac Risk Index (RCRI).  Therefore, she is at low risk for perioperative complications.    Recommendations: She does note shortness of breath in the setting of significant exhaustion.  In light of all of her recent health issues, I suspect her shortness of breath is related to exhaustion.  Ideally, she would benefit from an in person visit.   However, colonoscopy is a low risk procedure.  I do not think that she is at any prohibitive risk.  Therefore, according to ACC/AHA guidelines, no further cardiovascular testing needed.  The patient may proceed with colonoscopy at acceptable risk.  If, in turn, she needs surgery based upon the results of her colonoscopy, she will need an in person visit for evaluation and recommendations.    A copy of this note will be routed to requesting surgeon.  Time:   Today, I have spent 25 minutes with the patient with telehealth technology discussing medical history, symptoms, and management plan.     Richardson Dopp, PA-C  01/30/2022, 11:41 AM

## 2022-01-30 NOTE — Telephone Encounter (Signed)
Patient following up regarding clearance (see previous encounters. Please forward clearance recommendation to Dr. Venita Sheffield office and call patient to confirm completed.

## 2022-01-30 NOTE — Telephone Encounter (Signed)
Pt has been scheduled for a tele visit, today, 01/30/22 11:00.

## 2022-01-31 DIAGNOSIS — N321 Vesicointestinal fistula: Secondary | ICD-10-CM | POA: Diagnosis not present

## 2022-01-31 DIAGNOSIS — K5732 Diverticulitis of large intestine without perforation or abscess without bleeding: Secondary | ICD-10-CM | POA: Diagnosis not present

## 2022-01-31 DIAGNOSIS — J45909 Unspecified asthma, uncomplicated: Secondary | ICD-10-CM | POA: Diagnosis not present

## 2022-01-31 DIAGNOSIS — Z79899 Other long term (current) drug therapy: Secondary | ICD-10-CM | POA: Diagnosis not present

## 2022-01-31 DIAGNOSIS — I10 Essential (primary) hypertension: Secondary | ICD-10-CM | POA: Diagnosis not present

## 2022-01-31 DIAGNOSIS — K219 Gastro-esophageal reflux disease without esophagitis: Secondary | ICD-10-CM | POA: Diagnosis not present

## 2022-01-31 DIAGNOSIS — K5730 Diverticulosis of large intestine without perforation or abscess without bleeding: Secondary | ICD-10-CM | POA: Diagnosis not present

## 2022-01-31 DIAGNOSIS — K589 Irritable bowel syndrome without diarrhea: Secondary | ICD-10-CM | POA: Diagnosis not present

## 2022-02-01 DIAGNOSIS — D509 Iron deficiency anemia, unspecified: Secondary | ICD-10-CM | POA: Diagnosis not present

## 2022-02-01 DIAGNOSIS — I5032 Chronic diastolic (congestive) heart failure: Secondary | ICD-10-CM | POA: Diagnosis not present

## 2022-02-01 DIAGNOSIS — I8390 Asymptomatic varicose veins of unspecified lower extremity: Secondary | ICD-10-CM | POA: Diagnosis not present

## 2022-02-01 DIAGNOSIS — J45909 Unspecified asthma, uncomplicated: Secondary | ICD-10-CM | POA: Diagnosis not present

## 2022-02-01 DIAGNOSIS — I110 Hypertensive heart disease with heart failure: Secondary | ICD-10-CM | POA: Diagnosis not present

## 2022-02-01 DIAGNOSIS — I429 Cardiomyopathy, unspecified: Secondary | ICD-10-CM | POA: Diagnosis not present

## 2022-02-04 DIAGNOSIS — I8390 Asymptomatic varicose veins of unspecified lower extremity: Secondary | ICD-10-CM | POA: Diagnosis not present

## 2022-02-04 DIAGNOSIS — I5032 Chronic diastolic (congestive) heart failure: Secondary | ICD-10-CM | POA: Diagnosis not present

## 2022-02-04 DIAGNOSIS — D509 Iron deficiency anemia, unspecified: Secondary | ICD-10-CM | POA: Diagnosis not present

## 2022-02-04 DIAGNOSIS — I429 Cardiomyopathy, unspecified: Secondary | ICD-10-CM | POA: Diagnosis not present

## 2022-02-04 DIAGNOSIS — J45909 Unspecified asthma, uncomplicated: Secondary | ICD-10-CM | POA: Diagnosis not present

## 2022-02-04 DIAGNOSIS — I110 Hypertensive heart disease with heart failure: Secondary | ICD-10-CM | POA: Diagnosis not present

## 2022-02-05 DIAGNOSIS — E669 Obesity, unspecified: Secondary | ICD-10-CM | POA: Diagnosis not present

## 2022-02-05 DIAGNOSIS — J4540 Moderate persistent asthma, uncomplicated: Secondary | ICD-10-CM | POA: Diagnosis present

## 2022-02-05 DIAGNOSIS — N200 Calculus of kidney: Secondary | ICD-10-CM | POA: Diagnosis present

## 2022-02-05 DIAGNOSIS — Z9889 Other specified postprocedural states: Secondary | ICD-10-CM | POA: Diagnosis not present

## 2022-02-05 DIAGNOSIS — N2889 Other specified disorders of kidney and ureter: Secondary | ICD-10-CM | POA: Diagnosis not present

## 2022-02-05 DIAGNOSIS — K7689 Other specified diseases of liver: Secondary | ICD-10-CM | POA: Diagnosis not present

## 2022-02-05 DIAGNOSIS — D649 Anemia, unspecified: Secondary | ICD-10-CM | POA: Diagnosis present

## 2022-02-05 DIAGNOSIS — R319 Hematuria, unspecified: Secondary | ICD-10-CM | POA: Diagnosis not present

## 2022-02-05 DIAGNOSIS — I8390 Asymptomatic varicose veins of unspecified lower extremity: Secondary | ICD-10-CM | POA: Diagnosis not present

## 2022-02-05 DIAGNOSIS — F411 Generalized anxiety disorder: Secondary | ICD-10-CM | POA: Diagnosis present

## 2022-02-05 DIAGNOSIS — K651 Peritoneal abscess: Secondary | ICD-10-CM | POA: Diagnosis present

## 2022-02-05 DIAGNOSIS — I110 Hypertensive heart disease with heart failure: Secondary | ICD-10-CM | POA: Diagnosis not present

## 2022-02-05 DIAGNOSIS — Z466 Encounter for fitting and adjustment of urinary device: Secondary | ICD-10-CM | POA: Diagnosis not present

## 2022-02-05 DIAGNOSIS — C50919 Malignant neoplasm of unspecified site of unspecified female breast: Secondary | ICD-10-CM | POA: Diagnosis not present

## 2022-02-05 DIAGNOSIS — N183 Chronic kidney disease, stage 3 unspecified: Secondary | ICD-10-CM | POA: Diagnosis present

## 2022-02-05 DIAGNOSIS — Z853 Personal history of malignant neoplasm of breast: Secondary | ICD-10-CM | POA: Diagnosis not present

## 2022-02-05 DIAGNOSIS — R188 Other ascites: Secondary | ICD-10-CM | POA: Diagnosis not present

## 2022-02-05 DIAGNOSIS — E785 Hyperlipidemia, unspecified: Secondary | ICD-10-CM | POA: Diagnosis not present

## 2022-02-05 DIAGNOSIS — Z6830 Body mass index (BMI) 30.0-30.9, adult: Secondary | ICD-10-CM | POA: Diagnosis not present

## 2022-02-05 DIAGNOSIS — N368 Other specified disorders of urethra: Secondary | ICD-10-CM | POA: Diagnosis not present

## 2022-02-05 DIAGNOSIS — I509 Heart failure, unspecified: Secondary | ICD-10-CM | POA: Diagnosis present

## 2022-02-05 DIAGNOSIS — Z452 Encounter for adjustment and management of vascular access device: Secondary | ICD-10-CM | POA: Diagnosis not present

## 2022-02-05 DIAGNOSIS — D509 Iron deficiency anemia, unspecified: Secondary | ICD-10-CM | POA: Diagnosis not present

## 2022-02-05 DIAGNOSIS — S31115A Laceration without foreign body of abdominal wall, periumbilic region without penetration into peritoneal cavity, initial encounter: Secondary | ICD-10-CM | POA: Diagnosis present

## 2022-02-05 DIAGNOSIS — K6819 Other retroperitoneal abscess: Secondary | ICD-10-CM | POA: Diagnosis not present

## 2022-02-05 DIAGNOSIS — I130 Hypertensive heart and chronic kidney disease with heart failure and stage 1 through stage 4 chronic kidney disease, or unspecified chronic kidney disease: Secondary | ICD-10-CM | POA: Diagnosis present

## 2022-02-05 DIAGNOSIS — Z95828 Presence of other vascular implants and grafts: Secondary | ICD-10-CM | POA: Diagnosis not present

## 2022-02-05 DIAGNOSIS — M6259 Muscle wasting and atrophy, not elsewhere classified, multiple sites: Secondary | ICD-10-CM | POA: Diagnosis present

## 2022-02-05 DIAGNOSIS — Z87442 Personal history of urinary calculi: Secondary | ICD-10-CM | POA: Diagnosis not present

## 2022-02-05 DIAGNOSIS — Z9013 Acquired absence of bilateral breasts and nipples: Secondary | ICD-10-CM | POA: Diagnosis not present

## 2022-02-05 DIAGNOSIS — I959 Hypotension, unspecified: Secondary | ICD-10-CM | POA: Diagnosis not present

## 2022-02-05 DIAGNOSIS — N321 Vesicointestinal fistula: Secondary | ICD-10-CM | POA: Diagnosis present

## 2022-02-05 DIAGNOSIS — I10 Essential (primary) hypertension: Secondary | ICD-10-CM | POA: Diagnosis not present

## 2022-02-05 DIAGNOSIS — E43 Unspecified severe protein-calorie malnutrition: Secondary | ICD-10-CM | POA: Diagnosis present

## 2022-02-05 DIAGNOSIS — N179 Acute kidney failure, unspecified: Secondary | ICD-10-CM | POA: Diagnosis present

## 2022-02-05 DIAGNOSIS — B9689 Other specified bacterial agents as the cause of diseases classified elsewhere: Secondary | ICD-10-CM | POA: Diagnosis present

## 2022-02-05 DIAGNOSIS — I429 Cardiomyopathy, unspecified: Secondary | ICD-10-CM | POA: Diagnosis not present

## 2022-02-05 DIAGNOSIS — N390 Urinary tract infection, site not specified: Secondary | ICD-10-CM | POA: Diagnosis present

## 2022-02-05 DIAGNOSIS — Z936 Other artificial openings of urinary tract status: Secondary | ICD-10-CM | POA: Diagnosis not present

## 2022-02-05 DIAGNOSIS — E039 Hypothyroidism, unspecified: Secondary | ICD-10-CM | POA: Diagnosis present

## 2022-02-05 DIAGNOSIS — M4690 Unspecified inflammatory spondylopathy, site unspecified: Secondary | ICD-10-CM | POA: Diagnosis present

## 2022-02-05 DIAGNOSIS — E559 Vitamin D deficiency, unspecified: Secondary | ICD-10-CM | POA: Diagnosis present

## 2022-02-05 DIAGNOSIS — N1330 Unspecified hydronephrosis: Secondary | ICD-10-CM | POA: Diagnosis not present

## 2022-02-05 DIAGNOSIS — N322 Vesical fistula, not elsewhere classified: Secondary | ICD-10-CM | POA: Diagnosis not present

## 2022-02-05 DIAGNOSIS — J45909 Unspecified asthma, uncomplicated: Secondary | ICD-10-CM | POA: Diagnosis not present

## 2022-02-05 DIAGNOSIS — K219 Gastro-esophageal reflux disease without esophagitis: Secondary | ICD-10-CM | POA: Diagnosis not present

## 2022-02-05 DIAGNOSIS — B9620 Unspecified Escherichia coli [E. coli] as the cause of diseases classified elsewhere: Secondary | ICD-10-CM | POA: Diagnosis not present

## 2022-02-05 DIAGNOSIS — N134 Hydroureter: Secondary | ICD-10-CM | POA: Diagnosis not present

## 2022-02-05 DIAGNOSIS — I5032 Chronic diastolic (congestive) heart failure: Secondary | ICD-10-CM | POA: Diagnosis not present

## 2022-02-05 DIAGNOSIS — K5730 Diverticulosis of large intestine without perforation or abscess without bleeding: Secondary | ICD-10-CM | POA: Diagnosis present

## 2022-02-05 DIAGNOSIS — I872 Venous insufficiency (chronic) (peripheral): Secondary | ICD-10-CM | POA: Diagnosis present

## 2022-02-05 DIAGNOSIS — Z743 Need for continuous supervision: Secondary | ICD-10-CM | POA: Diagnosis not present

## 2022-02-05 DIAGNOSIS — N281 Cyst of kidney, acquired: Secondary | ICD-10-CM | POA: Diagnosis present

## 2022-02-05 DIAGNOSIS — Z960 Presence of urogenital implants: Secondary | ICD-10-CM | POA: Diagnosis present

## 2022-02-06 DIAGNOSIS — K5730 Diverticulosis of large intestine without perforation or abscess without bleeding: Secondary | ICD-10-CM | POA: Diagnosis present

## 2022-02-06 DIAGNOSIS — F411 Generalized anxiety disorder: Secondary | ICD-10-CM | POA: Diagnosis present

## 2022-02-06 DIAGNOSIS — Z466 Encounter for fitting and adjustment of urinary device: Secondary | ICD-10-CM | POA: Diagnosis not present

## 2022-02-06 DIAGNOSIS — I110 Hypertensive heart disease with heart failure: Secondary | ICD-10-CM | POA: Diagnosis not present

## 2022-02-06 DIAGNOSIS — B9689 Other specified bacterial agents as the cause of diseases classified elsewhere: Secondary | ICD-10-CM | POA: Diagnosis present

## 2022-02-06 DIAGNOSIS — N183 Chronic kidney disease, stage 3 unspecified: Secondary | ICD-10-CM | POA: Diagnosis present

## 2022-02-06 DIAGNOSIS — S31115A Laceration without foreign body of abdominal wall, periumbilic region without penetration into peritoneal cavity, initial encounter: Secondary | ICD-10-CM | POA: Diagnosis present

## 2022-02-06 DIAGNOSIS — I130 Hypertensive heart and chronic kidney disease with heart failure and stage 1 through stage 4 chronic kidney disease, or unspecified chronic kidney disease: Secondary | ICD-10-CM | POA: Diagnosis present

## 2022-02-06 DIAGNOSIS — N2889 Other specified disorders of kidney and ureter: Secondary | ICD-10-CM | POA: Diagnosis not present

## 2022-02-06 DIAGNOSIS — M4690 Unspecified inflammatory spondylopathy, site unspecified: Secondary | ICD-10-CM | POA: Diagnosis present

## 2022-02-06 DIAGNOSIS — K219 Gastro-esophageal reflux disease without esophagitis: Secondary | ICD-10-CM | POA: Diagnosis present

## 2022-02-06 DIAGNOSIS — E669 Obesity, unspecified: Secondary | ICD-10-CM | POA: Diagnosis not present

## 2022-02-06 DIAGNOSIS — N134 Hydroureter: Secondary | ICD-10-CM | POA: Diagnosis not present

## 2022-02-06 DIAGNOSIS — Z743 Need for continuous supervision: Secondary | ICD-10-CM | POA: Diagnosis not present

## 2022-02-06 DIAGNOSIS — I509 Heart failure, unspecified: Secondary | ICD-10-CM | POA: Diagnosis present

## 2022-02-06 DIAGNOSIS — N281 Cyst of kidney, acquired: Secondary | ICD-10-CM | POA: Diagnosis present

## 2022-02-06 DIAGNOSIS — Z6830 Body mass index (BMI) 30.0-30.9, adult: Secondary | ICD-10-CM | POA: Diagnosis not present

## 2022-02-06 DIAGNOSIS — E785 Hyperlipidemia, unspecified: Secondary | ICD-10-CM | POA: Diagnosis not present

## 2022-02-06 DIAGNOSIS — N1330 Unspecified hydronephrosis: Secondary | ICD-10-CM | POA: Diagnosis not present

## 2022-02-06 DIAGNOSIS — I959 Hypotension, unspecified: Secondary | ICD-10-CM | POA: Diagnosis not present

## 2022-02-06 DIAGNOSIS — N322 Vesical fistula, not elsewhere classified: Secondary | ICD-10-CM | POA: Diagnosis not present

## 2022-02-06 DIAGNOSIS — N179 Acute kidney failure, unspecified: Secondary | ICD-10-CM | POA: Diagnosis present

## 2022-02-06 DIAGNOSIS — Z452 Encounter for adjustment and management of vascular access device: Secondary | ICD-10-CM | POA: Diagnosis not present

## 2022-02-06 DIAGNOSIS — K6819 Other retroperitoneal abscess: Secondary | ICD-10-CM | POA: Diagnosis not present

## 2022-02-06 DIAGNOSIS — D649 Anemia, unspecified: Secondary | ICD-10-CM | POA: Diagnosis present

## 2022-02-06 DIAGNOSIS — E43 Unspecified severe protein-calorie malnutrition: Secondary | ICD-10-CM | POA: Diagnosis present

## 2022-02-06 DIAGNOSIS — I872 Venous insufficiency (chronic) (peripheral): Secondary | ICD-10-CM | POA: Diagnosis present

## 2022-02-06 DIAGNOSIS — K651 Peritoneal abscess: Secondary | ICD-10-CM | POA: Diagnosis present

## 2022-02-06 DIAGNOSIS — J4540 Moderate persistent asthma, uncomplicated: Secondary | ICD-10-CM | POA: Diagnosis present

## 2022-02-06 DIAGNOSIS — N200 Calculus of kidney: Secondary | ICD-10-CM | POA: Diagnosis present

## 2022-02-06 DIAGNOSIS — R188 Other ascites: Secondary | ICD-10-CM | POA: Diagnosis not present

## 2022-02-06 DIAGNOSIS — Z95828 Presence of other vascular implants and grafts: Secondary | ICD-10-CM | POA: Diagnosis not present

## 2022-02-06 DIAGNOSIS — Z960 Presence of urogenital implants: Secondary | ICD-10-CM | POA: Diagnosis present

## 2022-02-06 DIAGNOSIS — N321 Vesicointestinal fistula: Secondary | ICD-10-CM | POA: Diagnosis present

## 2022-02-06 DIAGNOSIS — N390 Urinary tract infection, site not specified: Secondary | ICD-10-CM | POA: Diagnosis present

## 2022-02-06 DIAGNOSIS — I10 Essential (primary) hypertension: Secondary | ICD-10-CM | POA: Diagnosis not present

## 2022-02-06 DIAGNOSIS — E559 Vitamin D deficiency, unspecified: Secondary | ICD-10-CM | POA: Diagnosis present

## 2022-02-06 DIAGNOSIS — Z87442 Personal history of urinary calculi: Secondary | ICD-10-CM | POA: Diagnosis not present

## 2022-02-06 DIAGNOSIS — C50919 Malignant neoplasm of unspecified site of unspecified female breast: Secondary | ICD-10-CM | POA: Diagnosis not present

## 2022-02-06 DIAGNOSIS — M6259 Muscle wasting and atrophy, not elsewhere classified, multiple sites: Secondary | ICD-10-CM | POA: Diagnosis present

## 2022-02-06 DIAGNOSIS — Z936 Other artificial openings of urinary tract status: Secondary | ICD-10-CM | POA: Diagnosis not present

## 2022-02-06 DIAGNOSIS — Z9889 Other specified postprocedural states: Secondary | ICD-10-CM | POA: Diagnosis not present

## 2022-02-06 DIAGNOSIS — N368 Other specified disorders of urethra: Secondary | ICD-10-CM | POA: Diagnosis not present

## 2022-02-06 DIAGNOSIS — Z9013 Acquired absence of bilateral breasts and nipples: Secondary | ICD-10-CM | POA: Diagnosis not present

## 2022-02-06 DIAGNOSIS — B9620 Unspecified Escherichia coli [E. coli] as the cause of diseases classified elsewhere: Secondary | ICD-10-CM | POA: Diagnosis present

## 2022-02-06 DIAGNOSIS — K7689 Other specified diseases of liver: Secondary | ICD-10-CM | POA: Diagnosis not present

## 2022-02-06 DIAGNOSIS — E039 Hypothyroidism, unspecified: Secondary | ICD-10-CM | POA: Diagnosis present

## 2022-02-06 DIAGNOSIS — Z853 Personal history of malignant neoplasm of breast: Secondary | ICD-10-CM | POA: Diagnosis not present

## 2022-02-07 DIAGNOSIS — K6819 Other retroperitoneal abscess: Secondary | ICD-10-CM | POA: Diagnosis not present

## 2022-02-07 DIAGNOSIS — I110 Hypertensive heart disease with heart failure: Secondary | ICD-10-CM | POA: Diagnosis not present

## 2022-02-07 DIAGNOSIS — K219 Gastro-esophageal reflux disease without esophagitis: Secondary | ICD-10-CM | POA: Diagnosis not present

## 2022-02-07 DIAGNOSIS — D649 Anemia, unspecified: Secondary | ICD-10-CM | POA: Diagnosis not present

## 2022-02-07 DIAGNOSIS — F411 Generalized anxiety disorder: Secondary | ICD-10-CM | POA: Diagnosis not present

## 2022-02-07 DIAGNOSIS — N390 Urinary tract infection, site not specified: Secondary | ICD-10-CM | POA: Diagnosis not present

## 2022-02-07 DIAGNOSIS — E785 Hyperlipidemia, unspecified: Secondary | ICD-10-CM | POA: Diagnosis not present

## 2022-02-07 DIAGNOSIS — I509 Heart failure, unspecified: Secondary | ICD-10-CM | POA: Diagnosis not present

## 2022-02-07 DIAGNOSIS — B9620 Unspecified Escherichia coli [E. coli] as the cause of diseases classified elsewhere: Secondary | ICD-10-CM | POA: Diagnosis not present

## 2022-02-07 DIAGNOSIS — E039 Hypothyroidism, unspecified: Secondary | ICD-10-CM | POA: Diagnosis not present

## 2022-02-07 DIAGNOSIS — E559 Vitamin D deficiency, unspecified: Secondary | ICD-10-CM | POA: Diagnosis not present

## 2022-02-07 DIAGNOSIS — C50919 Malignant neoplasm of unspecified site of unspecified female breast: Secondary | ICD-10-CM | POA: Diagnosis not present

## 2022-02-07 DIAGNOSIS — N368 Other specified disorders of urethra: Secondary | ICD-10-CM | POA: Diagnosis not present

## 2022-02-08 DIAGNOSIS — C50919 Malignant neoplasm of unspecified site of unspecified female breast: Secondary | ICD-10-CM | POA: Diagnosis not present

## 2022-02-08 DIAGNOSIS — B9620 Unspecified Escherichia coli [E. coli] as the cause of diseases classified elsewhere: Secondary | ICD-10-CM | POA: Diagnosis not present

## 2022-02-08 DIAGNOSIS — F411 Generalized anxiety disorder: Secondary | ICD-10-CM | POA: Diagnosis not present

## 2022-02-08 DIAGNOSIS — E559 Vitamin D deficiency, unspecified: Secondary | ICD-10-CM | POA: Diagnosis not present

## 2022-02-08 DIAGNOSIS — E039 Hypothyroidism, unspecified: Secondary | ICD-10-CM | POA: Diagnosis not present

## 2022-02-08 DIAGNOSIS — E785 Hyperlipidemia, unspecified: Secondary | ICD-10-CM | POA: Diagnosis not present

## 2022-02-08 DIAGNOSIS — I509 Heart failure, unspecified: Secondary | ICD-10-CM | POA: Diagnosis not present

## 2022-02-08 DIAGNOSIS — K219 Gastro-esophageal reflux disease without esophagitis: Secondary | ICD-10-CM | POA: Diagnosis not present

## 2022-02-08 DIAGNOSIS — N390 Urinary tract infection, site not specified: Secondary | ICD-10-CM | POA: Diagnosis not present

## 2022-02-08 DIAGNOSIS — N368 Other specified disorders of urethra: Secondary | ICD-10-CM | POA: Diagnosis not present

## 2022-02-08 DIAGNOSIS — D649 Anemia, unspecified: Secondary | ICD-10-CM | POA: Diagnosis not present

## 2022-02-08 DIAGNOSIS — K6819 Other retroperitoneal abscess: Secondary | ICD-10-CM | POA: Diagnosis not present

## 2022-02-08 DIAGNOSIS — I110 Hypertensive heart disease with heart failure: Secondary | ICD-10-CM | POA: Diagnosis not present

## 2022-02-09 DIAGNOSIS — I509 Heart failure, unspecified: Secondary | ICD-10-CM | POA: Diagnosis not present

## 2022-02-09 DIAGNOSIS — N183 Chronic kidney disease, stage 3 unspecified: Secondary | ICD-10-CM | POA: Diagnosis not present

## 2022-02-09 DIAGNOSIS — C50919 Malignant neoplasm of unspecified site of unspecified female breast: Secondary | ICD-10-CM | POA: Diagnosis not present

## 2022-02-09 DIAGNOSIS — E559 Vitamin D deficiency, unspecified: Secondary | ICD-10-CM | POA: Diagnosis not present

## 2022-02-09 DIAGNOSIS — I130 Hypertensive heart and chronic kidney disease with heart failure and stage 1 through stage 4 chronic kidney disease, or unspecified chronic kidney disease: Secondary | ICD-10-CM | POA: Diagnosis not present

## 2022-02-09 DIAGNOSIS — E039 Hypothyroidism, unspecified: Secondary | ICD-10-CM | POA: Diagnosis not present

## 2022-02-09 DIAGNOSIS — D649 Anemia, unspecified: Secondary | ICD-10-CM | POA: Diagnosis not present

## 2022-02-09 DIAGNOSIS — N390 Urinary tract infection, site not specified: Secondary | ICD-10-CM | POA: Diagnosis not present

## 2022-02-09 DIAGNOSIS — K219 Gastro-esophageal reflux disease without esophagitis: Secondary | ICD-10-CM | POA: Diagnosis not present

## 2022-02-09 DIAGNOSIS — F411 Generalized anxiety disorder: Secondary | ICD-10-CM | POA: Diagnosis not present

## 2022-02-09 DIAGNOSIS — B9620 Unspecified Escherichia coli [E. coli] as the cause of diseases classified elsewhere: Secondary | ICD-10-CM | POA: Diagnosis not present

## 2022-02-09 DIAGNOSIS — E785 Hyperlipidemia, unspecified: Secondary | ICD-10-CM | POA: Diagnosis not present

## 2022-02-10 DIAGNOSIS — E559 Vitamin D deficiency, unspecified: Secondary | ICD-10-CM | POA: Diagnosis not present

## 2022-02-10 DIAGNOSIS — C50919 Malignant neoplasm of unspecified site of unspecified female breast: Secondary | ICD-10-CM | POA: Diagnosis not present

## 2022-02-10 DIAGNOSIS — N183 Chronic kidney disease, stage 3 unspecified: Secondary | ICD-10-CM | POA: Diagnosis not present

## 2022-02-10 DIAGNOSIS — I130 Hypertensive heart and chronic kidney disease with heart failure and stage 1 through stage 4 chronic kidney disease, or unspecified chronic kidney disease: Secondary | ICD-10-CM | POA: Diagnosis not present

## 2022-02-10 DIAGNOSIS — E039 Hypothyroidism, unspecified: Secondary | ICD-10-CM | POA: Diagnosis not present

## 2022-02-10 DIAGNOSIS — E785 Hyperlipidemia, unspecified: Secondary | ICD-10-CM | POA: Diagnosis not present

## 2022-02-10 DIAGNOSIS — K219 Gastro-esophageal reflux disease without esophagitis: Secondary | ICD-10-CM | POA: Diagnosis not present

## 2022-02-10 DIAGNOSIS — F411 Generalized anxiety disorder: Secondary | ICD-10-CM | POA: Diagnosis not present

## 2022-02-10 DIAGNOSIS — B9620 Unspecified Escherichia coli [E. coli] as the cause of diseases classified elsewhere: Secondary | ICD-10-CM | POA: Diagnosis not present

## 2022-02-10 DIAGNOSIS — N390 Urinary tract infection, site not specified: Secondary | ICD-10-CM | POA: Diagnosis not present

## 2022-02-10 DIAGNOSIS — D649 Anemia, unspecified: Secondary | ICD-10-CM | POA: Diagnosis not present

## 2022-02-10 DIAGNOSIS — I509 Heart failure, unspecified: Secondary | ICD-10-CM | POA: Diagnosis not present

## 2022-02-11 DIAGNOSIS — B9689 Other specified bacterial agents as the cause of diseases classified elsewhere: Secondary | ICD-10-CM | POA: Diagnosis not present

## 2022-02-11 DIAGNOSIS — I130 Hypertensive heart and chronic kidney disease with heart failure and stage 1 through stage 4 chronic kidney disease, or unspecified chronic kidney disease: Secondary | ICD-10-CM | POA: Diagnosis not present

## 2022-02-11 DIAGNOSIS — C50919 Malignant neoplasm of unspecified site of unspecified female breast: Secondary | ICD-10-CM | POA: Diagnosis not present

## 2022-02-11 DIAGNOSIS — Z87442 Personal history of urinary calculi: Secondary | ICD-10-CM | POA: Diagnosis not present

## 2022-02-11 DIAGNOSIS — N390 Urinary tract infection, site not specified: Secondary | ICD-10-CM | POA: Diagnosis not present

## 2022-02-11 DIAGNOSIS — F411 Generalized anxiety disorder: Secondary | ICD-10-CM | POA: Diagnosis not present

## 2022-02-11 DIAGNOSIS — N183 Chronic kidney disease, stage 3 unspecified: Secondary | ICD-10-CM | POA: Diagnosis not present

## 2022-02-11 DIAGNOSIS — E785 Hyperlipidemia, unspecified: Secondary | ICD-10-CM | POA: Diagnosis not present

## 2022-02-11 DIAGNOSIS — D649 Anemia, unspecified: Secondary | ICD-10-CM | POA: Diagnosis not present

## 2022-02-11 DIAGNOSIS — K6819 Other retroperitoneal abscess: Secondary | ICD-10-CM | POA: Diagnosis not present

## 2022-02-11 DIAGNOSIS — E559 Vitamin D deficiency, unspecified: Secondary | ICD-10-CM | POA: Diagnosis not present

## 2022-02-11 DIAGNOSIS — B9620 Unspecified Escherichia coli [E. coli] as the cause of diseases classified elsewhere: Secondary | ICD-10-CM | POA: Diagnosis not present

## 2022-02-11 DIAGNOSIS — I509 Heart failure, unspecified: Secondary | ICD-10-CM | POA: Diagnosis not present

## 2022-02-11 DIAGNOSIS — E039 Hypothyroidism, unspecified: Secondary | ICD-10-CM | POA: Diagnosis not present

## 2022-02-11 DIAGNOSIS — K219 Gastro-esophageal reflux disease without esophagitis: Secondary | ICD-10-CM | POA: Diagnosis not present

## 2022-02-12 DIAGNOSIS — D649 Anemia, unspecified: Secondary | ICD-10-CM | POA: Diagnosis not present

## 2022-02-12 DIAGNOSIS — B9689 Other specified bacterial agents as the cause of diseases classified elsewhere: Secondary | ICD-10-CM | POA: Diagnosis not present

## 2022-02-12 DIAGNOSIS — K219 Gastro-esophageal reflux disease without esophagitis: Secondary | ICD-10-CM | POA: Diagnosis not present

## 2022-02-12 DIAGNOSIS — I509 Heart failure, unspecified: Secondary | ICD-10-CM | POA: Diagnosis not present

## 2022-02-12 DIAGNOSIS — B9620 Unspecified Escherichia coli [E. coli] as the cause of diseases classified elsewhere: Secondary | ICD-10-CM | POA: Diagnosis not present

## 2022-02-12 DIAGNOSIS — N390 Urinary tract infection, site not specified: Secondary | ICD-10-CM | POA: Diagnosis not present

## 2022-02-12 DIAGNOSIS — Z87442 Personal history of urinary calculi: Secondary | ICD-10-CM | POA: Diagnosis not present

## 2022-02-12 DIAGNOSIS — I130 Hypertensive heart and chronic kidney disease with heart failure and stage 1 through stage 4 chronic kidney disease, or unspecified chronic kidney disease: Secondary | ICD-10-CM | POA: Diagnosis not present

## 2022-02-12 DIAGNOSIS — N179 Acute kidney failure, unspecified: Secondary | ICD-10-CM | POA: Diagnosis not present

## 2022-02-12 DIAGNOSIS — E039 Hypothyroidism, unspecified: Secondary | ICD-10-CM | POA: Diagnosis not present

## 2022-02-12 DIAGNOSIS — N183 Chronic kidney disease, stage 3 unspecified: Secondary | ICD-10-CM | POA: Diagnosis not present

## 2022-02-12 DIAGNOSIS — K6819 Other retroperitoneal abscess: Secondary | ICD-10-CM | POA: Diagnosis not present

## 2022-02-12 DIAGNOSIS — E785 Hyperlipidemia, unspecified: Secondary | ICD-10-CM | POA: Diagnosis not present

## 2022-02-12 DIAGNOSIS — E559 Vitamin D deficiency, unspecified: Secondary | ICD-10-CM | POA: Diagnosis not present

## 2022-02-13 DIAGNOSIS — N179 Acute kidney failure, unspecified: Secondary | ICD-10-CM | POA: Diagnosis not present

## 2022-02-13 DIAGNOSIS — N183 Chronic kidney disease, stage 3 unspecified: Secondary | ICD-10-CM | POA: Diagnosis not present

## 2022-02-13 DIAGNOSIS — K219 Gastro-esophageal reflux disease without esophagitis: Secondary | ICD-10-CM | POA: Diagnosis not present

## 2022-02-13 DIAGNOSIS — E785 Hyperlipidemia, unspecified: Secondary | ICD-10-CM | POA: Diagnosis not present

## 2022-02-13 DIAGNOSIS — I509 Heart failure, unspecified: Secondary | ICD-10-CM | POA: Diagnosis not present

## 2022-02-13 DIAGNOSIS — E039 Hypothyroidism, unspecified: Secondary | ICD-10-CM | POA: Diagnosis not present

## 2022-02-13 DIAGNOSIS — N390 Urinary tract infection, site not specified: Secondary | ICD-10-CM | POA: Diagnosis not present

## 2022-02-13 DIAGNOSIS — K6819 Other retroperitoneal abscess: Secondary | ICD-10-CM | POA: Diagnosis not present

## 2022-02-13 DIAGNOSIS — B9689 Other specified bacterial agents as the cause of diseases classified elsewhere: Secondary | ICD-10-CM | POA: Diagnosis not present

## 2022-02-13 DIAGNOSIS — D649 Anemia, unspecified: Secondary | ICD-10-CM | POA: Diagnosis not present

## 2022-02-13 DIAGNOSIS — E559 Vitamin D deficiency, unspecified: Secondary | ICD-10-CM | POA: Diagnosis not present

## 2022-02-13 DIAGNOSIS — Z87442 Personal history of urinary calculi: Secondary | ICD-10-CM | POA: Diagnosis not present

## 2022-02-13 DIAGNOSIS — B9620 Unspecified Escherichia coli [E. coli] as the cause of diseases classified elsewhere: Secondary | ICD-10-CM | POA: Diagnosis not present

## 2022-02-13 DIAGNOSIS — I130 Hypertensive heart and chronic kidney disease with heart failure and stage 1 through stage 4 chronic kidney disease, or unspecified chronic kidney disease: Secondary | ICD-10-CM | POA: Diagnosis not present

## 2022-02-14 DIAGNOSIS — D649 Anemia, unspecified: Secondary | ICD-10-CM | POA: Diagnosis not present

## 2022-02-14 DIAGNOSIS — B9620 Unspecified Escherichia coli [E. coli] as the cause of diseases classified elsewhere: Secondary | ICD-10-CM | POA: Diagnosis not present

## 2022-02-14 DIAGNOSIS — I509 Heart failure, unspecified: Secondary | ICD-10-CM | POA: Diagnosis not present

## 2022-02-14 DIAGNOSIS — K219 Gastro-esophageal reflux disease without esophagitis: Secondary | ICD-10-CM | POA: Diagnosis not present

## 2022-02-14 DIAGNOSIS — N183 Chronic kidney disease, stage 3 unspecified: Secondary | ICD-10-CM | POA: Diagnosis not present

## 2022-02-14 DIAGNOSIS — N179 Acute kidney failure, unspecified: Secondary | ICD-10-CM | POA: Diagnosis not present

## 2022-02-14 DIAGNOSIS — E039 Hypothyroidism, unspecified: Secondary | ICD-10-CM | POA: Diagnosis not present

## 2022-02-14 DIAGNOSIS — I130 Hypertensive heart and chronic kidney disease with heart failure and stage 1 through stage 4 chronic kidney disease, or unspecified chronic kidney disease: Secondary | ICD-10-CM | POA: Diagnosis not present

## 2022-02-14 DIAGNOSIS — K6819 Other retroperitoneal abscess: Secondary | ICD-10-CM | POA: Diagnosis not present

## 2022-02-14 DIAGNOSIS — N390 Urinary tract infection, site not specified: Secondary | ICD-10-CM | POA: Diagnosis not present

## 2022-02-14 DIAGNOSIS — E785 Hyperlipidemia, unspecified: Secondary | ICD-10-CM | POA: Diagnosis not present

## 2022-02-14 DIAGNOSIS — E559 Vitamin D deficiency, unspecified: Secondary | ICD-10-CM | POA: Diagnosis not present

## 2022-03-06 DIAGNOSIS — D649 Anemia, unspecified: Secondary | ICD-10-CM | POA: Diagnosis not present

## 2022-03-06 DIAGNOSIS — K651 Peritoneal abscess: Secondary | ICD-10-CM | POA: Diagnosis not present

## 2022-03-06 DIAGNOSIS — F419 Anxiety disorder, unspecified: Secondary | ICD-10-CM | POA: Diagnosis not present

## 2022-03-06 DIAGNOSIS — E785 Hyperlipidemia, unspecified: Secondary | ICD-10-CM | POA: Diagnosis not present

## 2022-03-06 DIAGNOSIS — B9620 Unspecified Escherichia coli [E. coli] as the cause of diseases classified elsewhere: Secondary | ICD-10-CM | POA: Diagnosis not present

## 2022-03-06 DIAGNOSIS — K6819 Other retroperitoneal abscess: Secondary | ICD-10-CM | POA: Diagnosis not present

## 2022-03-06 DIAGNOSIS — K219 Gastro-esophageal reflux disease without esophagitis: Secondary | ICD-10-CM | POA: Diagnosis not present

## 2022-03-06 DIAGNOSIS — Z9221 Personal history of antineoplastic chemotherapy: Secondary | ICD-10-CM | POA: Diagnosis not present

## 2022-03-06 DIAGNOSIS — J4540 Moderate persistent asthma, uncomplicated: Secondary | ICD-10-CM | POA: Diagnosis present

## 2022-03-06 DIAGNOSIS — B3749 Other urogenital candidiasis: Secondary | ICD-10-CM | POA: Diagnosis not present

## 2022-03-06 DIAGNOSIS — S3710XA Unspecified injury of ureter, initial encounter: Secondary | ICD-10-CM | POA: Diagnosis not present

## 2022-03-06 DIAGNOSIS — F325 Major depressive disorder, single episode, in full remission: Secondary | ICD-10-CM | POA: Diagnosis not present

## 2022-03-06 DIAGNOSIS — Z743 Need for continuous supervision: Secondary | ICD-10-CM | POA: Diagnosis not present

## 2022-03-06 DIAGNOSIS — M4690 Unspecified inflammatory spondylopathy, site unspecified: Secondary | ICD-10-CM | POA: Diagnosis present

## 2022-03-06 DIAGNOSIS — E559 Vitamin D deficiency, unspecified: Secondary | ICD-10-CM | POA: Diagnosis not present

## 2022-03-06 DIAGNOSIS — N390 Urinary tract infection, site not specified: Secondary | ICD-10-CM | POA: Diagnosis not present

## 2022-03-06 DIAGNOSIS — T191XXA Foreign body in bladder, initial encounter: Secondary | ICD-10-CM | POA: Diagnosis not present

## 2022-03-06 DIAGNOSIS — M6259 Muscle wasting and atrophy, not elsewhere classified, multiple sites: Secondary | ICD-10-CM | POA: Diagnosis not present

## 2022-03-06 DIAGNOSIS — N179 Acute kidney failure, unspecified: Secondary | ICD-10-CM | POA: Diagnosis not present

## 2022-03-06 DIAGNOSIS — I959 Hypotension, unspecified: Secondary | ICD-10-CM | POA: Diagnosis not present

## 2022-03-06 DIAGNOSIS — J302 Other seasonal allergic rhinitis: Secondary | ICD-10-CM | POA: Diagnosis not present

## 2022-03-06 DIAGNOSIS — J385 Laryngeal spasm: Secondary | ICD-10-CM | POA: Diagnosis not present

## 2022-03-06 DIAGNOSIS — N321 Vesicointestinal fistula: Secondary | ICD-10-CM | POA: Diagnosis not present

## 2022-03-06 DIAGNOSIS — N183 Chronic kidney disease, stage 3 unspecified: Secondary | ICD-10-CM | POA: Diagnosis not present

## 2022-03-06 DIAGNOSIS — I509 Heart failure, unspecified: Secondary | ICD-10-CM | POA: Diagnosis not present

## 2022-03-06 DIAGNOSIS — G4700 Insomnia, unspecified: Secondary | ICD-10-CM | POA: Diagnosis not present

## 2022-03-06 DIAGNOSIS — I130 Hypertensive heart and chronic kidney disease with heart failure and stage 1 through stage 4 chronic kidney disease, or unspecified chronic kidney disease: Secondary | ICD-10-CM | POA: Diagnosis not present

## 2022-03-06 DIAGNOSIS — D631 Anemia in chronic kidney disease: Secondary | ICD-10-CM | POA: Diagnosis not present

## 2022-03-06 DIAGNOSIS — E039 Hypothyroidism, unspecified: Secondary | ICD-10-CM | POA: Diagnosis not present

## 2022-03-06 DIAGNOSIS — I129 Hypertensive chronic kidney disease with stage 1 through stage 4 chronic kidney disease, or unspecified chronic kidney disease: Secondary | ICD-10-CM | POA: Diagnosis not present

## 2022-03-06 DIAGNOSIS — I10 Essential (primary) hypertension: Secondary | ICD-10-CM | POA: Diagnosis not present

## 2022-03-06 DIAGNOSIS — M1990 Unspecified osteoarthritis, unspecified site: Secondary | ICD-10-CM | POA: Diagnosis not present

## 2022-03-06 DIAGNOSIS — Z960 Presence of urogenital implants: Secondary | ICD-10-CM | POA: Diagnosis not present

## 2022-03-06 DIAGNOSIS — Z8639 Personal history of other endocrine, nutritional and metabolic disease: Secondary | ICD-10-CM | POA: Diagnosis not present

## 2022-03-06 DIAGNOSIS — R5381 Other malaise: Secondary | ICD-10-CM | POA: Diagnosis not present

## 2022-03-06 DIAGNOSIS — N200 Calculus of kidney: Secondary | ICD-10-CM | POA: Diagnosis not present

## 2022-03-06 DIAGNOSIS — E43 Unspecified severe protein-calorie malnutrition: Secondary | ICD-10-CM | POA: Diagnosis not present

## 2022-03-06 DIAGNOSIS — E7800 Pure hypercholesterolemia, unspecified: Secondary | ICD-10-CM | POA: Diagnosis not present

## 2022-03-06 DIAGNOSIS — I872 Venous insufficiency (chronic) (peripheral): Secondary | ICD-10-CM | POA: Diagnosis not present

## 2022-03-08 DIAGNOSIS — G4700 Insomnia, unspecified: Secondary | ICD-10-CM | POA: Diagnosis not present

## 2022-03-08 DIAGNOSIS — F325 Major depressive disorder, single episode, in full remission: Secondary | ICD-10-CM | POA: Diagnosis not present

## 2022-03-08 DIAGNOSIS — M1990 Unspecified osteoarthritis, unspecified site: Secondary | ICD-10-CM | POA: Diagnosis not present

## 2022-03-08 DIAGNOSIS — I129 Hypertensive chronic kidney disease with stage 1 through stage 4 chronic kidney disease, or unspecified chronic kidney disease: Secondary | ICD-10-CM | POA: Diagnosis not present

## 2022-03-08 DIAGNOSIS — Z9221 Personal history of antineoplastic chemotherapy: Secondary | ICD-10-CM | POA: Diagnosis not present

## 2022-03-08 DIAGNOSIS — N183 Chronic kidney disease, stage 3 unspecified: Secondary | ICD-10-CM | POA: Diagnosis not present

## 2022-03-08 DIAGNOSIS — Z8639 Personal history of other endocrine, nutritional and metabolic disease: Secondary | ICD-10-CM | POA: Diagnosis not present

## 2022-03-08 DIAGNOSIS — N200 Calculus of kidney: Secondary | ICD-10-CM | POA: Diagnosis not present

## 2022-03-08 DIAGNOSIS — J385 Laryngeal spasm: Secondary | ICD-10-CM | POA: Diagnosis not present

## 2022-03-08 DIAGNOSIS — N390 Urinary tract infection, site not specified: Secondary | ICD-10-CM | POA: Diagnosis not present

## 2022-03-08 DIAGNOSIS — J302 Other seasonal allergic rhinitis: Secondary | ICD-10-CM | POA: Diagnosis not present

## 2022-03-08 DIAGNOSIS — E7800 Pure hypercholesterolemia, unspecified: Secondary | ICD-10-CM | POA: Diagnosis not present

## 2022-03-08 DIAGNOSIS — K6819 Other retroperitoneal abscess: Secondary | ICD-10-CM | POA: Diagnosis not present

## 2022-03-08 DIAGNOSIS — F419 Anxiety disorder, unspecified: Secondary | ICD-10-CM | POA: Diagnosis not present

## 2022-03-08 DIAGNOSIS — R5381 Other malaise: Secondary | ICD-10-CM | POA: Diagnosis not present

## 2022-03-08 DIAGNOSIS — K219 Gastro-esophageal reflux disease without esophagitis: Secondary | ICD-10-CM | POA: Diagnosis not present

## 2022-03-08 DIAGNOSIS — S3710XA Unspecified injury of ureter, initial encounter: Secondary | ICD-10-CM | POA: Diagnosis not present

## 2022-03-08 DIAGNOSIS — Z960 Presence of urogenital implants: Secondary | ICD-10-CM | POA: Diagnosis not present

## 2022-03-08 DIAGNOSIS — D631 Anemia in chronic kidney disease: Secondary | ICD-10-CM | POA: Diagnosis not present

## 2022-03-18 DIAGNOSIS — E039 Hypothyroidism, unspecified: Secondary | ICD-10-CM | POA: Diagnosis not present

## 2022-03-18 DIAGNOSIS — I10 Essential (primary) hypertension: Secondary | ICD-10-CM | POA: Diagnosis not present

## 2022-03-18 DIAGNOSIS — E785 Hyperlipidemia, unspecified: Secondary | ICD-10-CM | POA: Diagnosis not present

## 2022-03-19 DIAGNOSIS — T191XXA Foreign body in bladder, initial encounter: Secondary | ICD-10-CM | POA: Diagnosis not present

## 2022-03-19 DIAGNOSIS — N200 Calculus of kidney: Secondary | ICD-10-CM | POA: Diagnosis not present

## 2022-03-29 DIAGNOSIS — I10 Essential (primary) hypertension: Secondary | ICD-10-CM | POA: Diagnosis not present

## 2022-03-29 DIAGNOSIS — S3710XA Unspecified injury of ureter, initial encounter: Secondary | ICD-10-CM | POA: Diagnosis not present

## 2022-03-29 DIAGNOSIS — D649 Anemia, unspecified: Secondary | ICD-10-CM | POA: Diagnosis not present

## 2022-04-02 DIAGNOSIS — N200 Calculus of kidney: Secondary | ICD-10-CM | POA: Diagnosis not present

## 2022-04-03 ENCOUNTER — Telehealth: Payer: Self-pay

## 2022-04-03 DIAGNOSIS — N183 Chronic kidney disease, stage 3 unspecified: Secondary | ICD-10-CM | POA: Diagnosis not present

## 2022-04-03 DIAGNOSIS — B3749 Other urogenital candidiasis: Secondary | ICD-10-CM | POA: Diagnosis not present

## 2022-04-03 DIAGNOSIS — N200 Calculus of kidney: Secondary | ICD-10-CM | POA: Diagnosis not present

## 2022-04-03 DIAGNOSIS — K6819 Other retroperitoneal abscess: Secondary | ICD-10-CM | POA: Diagnosis not present

## 2022-04-03 NOTE — Patient Outreach (Signed)
  Care Coordination   04/03/2022 Name: Katelyn Watson MRN: [id number] DOB: 1946/04/22   Care Coordination Outreach Attempts:  A second unsuccessful outreach was attempted today to offer the patient with information about available care coordination services as a benefit of their health plan.     Follow Up Plan:  Additional outreach attempts will be made to offer the patient care coordination information and services.   Encounter Outcome:  No Answer  Care Coordination Interventions Activated:  No   Care Coordination Interventions:  No, not indicated    Tomasa Rand, RN, BSN, CEN [facility] [employer] [phone]

## 2022-04-08 DIAGNOSIS — S3710XA Unspecified injury of ureter, initial encounter: Secondary | ICD-10-CM | POA: Diagnosis not present

## 2022-04-08 DIAGNOSIS — I10 Essential (primary) hypertension: Secondary | ICD-10-CM | POA: Diagnosis not present

## 2022-04-08 DIAGNOSIS — D649 Anemia, unspecified: Secondary | ICD-10-CM | POA: Diagnosis not present

## 2022-04-08 DIAGNOSIS — K219 Gastro-esophageal reflux disease without esophagitis: Secondary | ICD-10-CM | POA: Diagnosis not present

## 2022-04-08 DIAGNOSIS — K6819 Other retroperitoneal abscess: Secondary | ICD-10-CM | POA: Diagnosis not present

## 2022-04-08 DIAGNOSIS — N200 Calculus of kidney: Secondary | ICD-10-CM | POA: Diagnosis not present

## 2022-04-08 DIAGNOSIS — F325 Major depressive disorder, single episode, in full remission: Secondary | ICD-10-CM | POA: Diagnosis not present

## 2022-04-08 DIAGNOSIS — E039 Hypothyroidism, unspecified: Secondary | ICD-10-CM | POA: Diagnosis not present

## 2022-04-11 DIAGNOSIS — I10 Essential (primary) hypertension: Secondary | ICD-10-CM | POA: Diagnosis not present

## 2022-04-11 DIAGNOSIS — I509 Heart failure, unspecified: Secondary | ICD-10-CM | POA: Diagnosis not present

## 2022-04-11 DIAGNOSIS — N183 Chronic kidney disease, stage 3 unspecified: Secondary | ICD-10-CM | POA: Diagnosis not present

## 2022-04-12 DIAGNOSIS — E039 Hypothyroidism, unspecified: Secondary | ICD-10-CM | POA: Diagnosis not present

## 2022-04-12 DIAGNOSIS — N189 Chronic kidney disease, unspecified: Secondary | ICD-10-CM | POA: Diagnosis not present

## 2022-04-12 DIAGNOSIS — K6819 Other retroperitoneal abscess: Secondary | ICD-10-CM | POA: Diagnosis not present

## 2022-04-12 DIAGNOSIS — F325 Major depressive disorder, single episode, in full remission: Secondary | ICD-10-CM | POA: Diagnosis not present

## 2022-04-12 DIAGNOSIS — I129 Hypertensive chronic kidney disease with stage 1 through stage 4 chronic kidney disease, or unspecified chronic kidney disease: Secondary | ICD-10-CM | POA: Diagnosis not present

## 2022-04-12 DIAGNOSIS — E7800 Pure hypercholesterolemia, unspecified: Secondary | ICD-10-CM | POA: Diagnosis not present

## 2022-04-12 DIAGNOSIS — D509 Iron deficiency anemia, unspecified: Secondary | ICD-10-CM | POA: Diagnosis not present

## 2022-04-16 DIAGNOSIS — R2689 Other abnormalities of gait and mobility: Secondary | ICD-10-CM | POA: Diagnosis not present

## 2022-04-16 DIAGNOSIS — R1311 Dysphagia, oral phase: Secondary | ICD-10-CM | POA: Diagnosis not present

## 2022-04-16 DIAGNOSIS — K6819 Other retroperitoneal abscess: Secondary | ICD-10-CM | POA: Diagnosis not present

## 2022-04-16 DIAGNOSIS — R279 Unspecified lack of coordination: Secondary | ICD-10-CM | POA: Diagnosis not present

## 2022-04-16 DIAGNOSIS — M6259 Muscle wasting and atrophy, not elsewhere classified, multiple sites: Secondary | ICD-10-CM | POA: Diagnosis not present

## 2022-04-16 DIAGNOSIS — M6281 Muscle weakness (generalized): Secondary | ICD-10-CM | POA: Diagnosis not present

## 2022-04-17 DIAGNOSIS — R279 Unspecified lack of coordination: Secondary | ICD-10-CM | POA: Diagnosis not present

## 2022-04-17 DIAGNOSIS — M6281 Muscle weakness (generalized): Secondary | ICD-10-CM | POA: Diagnosis not present

## 2022-04-17 DIAGNOSIS — R1311 Dysphagia, oral phase: Secondary | ICD-10-CM | POA: Diagnosis not present

## 2022-04-17 DIAGNOSIS — M6259 Muscle wasting and atrophy, not elsewhere classified, multiple sites: Secondary | ICD-10-CM | POA: Diagnosis not present

## 2022-04-17 DIAGNOSIS — K6819 Other retroperitoneal abscess: Secondary | ICD-10-CM | POA: Diagnosis not present

## 2022-04-17 DIAGNOSIS — E559 Vitamin D deficiency, unspecified: Secondary | ICD-10-CM | POA: Diagnosis not present

## 2022-04-17 DIAGNOSIS — R2689 Other abnormalities of gait and mobility: Secondary | ICD-10-CM | POA: Diagnosis not present

## 2022-04-17 DIAGNOSIS — Z79899 Other long term (current) drug therapy: Secondary | ICD-10-CM | POA: Diagnosis not present

## 2022-04-18 DIAGNOSIS — K6819 Other retroperitoneal abscess: Secondary | ICD-10-CM | POA: Diagnosis not present

## 2022-04-18 DIAGNOSIS — R279 Unspecified lack of coordination: Secondary | ICD-10-CM | POA: Diagnosis not present

## 2022-04-18 DIAGNOSIS — R1311 Dysphagia, oral phase: Secondary | ICD-10-CM | POA: Diagnosis not present

## 2022-04-18 DIAGNOSIS — R2689 Other abnormalities of gait and mobility: Secondary | ICD-10-CM | POA: Diagnosis not present

## 2022-04-18 DIAGNOSIS — M6281 Muscle weakness (generalized): Secondary | ICD-10-CM | POA: Diagnosis not present

## 2022-04-18 DIAGNOSIS — M6259 Muscle wasting and atrophy, not elsewhere classified, multiple sites: Secondary | ICD-10-CM | POA: Diagnosis not present

## 2022-04-19 DIAGNOSIS — M6281 Muscle weakness (generalized): Secondary | ICD-10-CM | POA: Diagnosis not present

## 2022-04-19 DIAGNOSIS — K6819 Other retroperitoneal abscess: Secondary | ICD-10-CM | POA: Diagnosis not present

## 2022-04-19 DIAGNOSIS — E039 Hypothyroidism, unspecified: Secondary | ICD-10-CM | POA: Diagnosis not present

## 2022-04-19 DIAGNOSIS — R279 Unspecified lack of coordination: Secondary | ICD-10-CM | POA: Diagnosis not present

## 2022-04-19 DIAGNOSIS — R1311 Dysphagia, oral phase: Secondary | ICD-10-CM | POA: Diagnosis not present

## 2022-04-19 DIAGNOSIS — R5381 Other malaise: Secondary | ICD-10-CM | POA: Diagnosis not present

## 2022-04-19 DIAGNOSIS — N1831 Chronic kidney disease, stage 3a: Secondary | ICD-10-CM | POA: Diagnosis not present

## 2022-04-19 DIAGNOSIS — R2689 Other abnormalities of gait and mobility: Secondary | ICD-10-CM | POA: Diagnosis not present

## 2022-04-19 DIAGNOSIS — M6259 Muscle wasting and atrophy, not elsewhere classified, multiple sites: Secondary | ICD-10-CM | POA: Diagnosis not present

## 2022-04-19 DIAGNOSIS — D649 Anemia, unspecified: Secondary | ICD-10-CM | POA: Diagnosis not present

## 2022-04-20 DIAGNOSIS — M6281 Muscle weakness (generalized): Secondary | ICD-10-CM | POA: Diagnosis not present

## 2022-04-20 DIAGNOSIS — R1311 Dysphagia, oral phase: Secondary | ICD-10-CM | POA: Diagnosis not present

## 2022-04-20 DIAGNOSIS — K6819 Other retroperitoneal abscess: Secondary | ICD-10-CM | POA: Diagnosis not present

## 2022-04-20 DIAGNOSIS — R279 Unspecified lack of coordination: Secondary | ICD-10-CM | POA: Diagnosis not present

## 2022-04-20 DIAGNOSIS — M6259 Muscle wasting and atrophy, not elsewhere classified, multiple sites: Secondary | ICD-10-CM | POA: Diagnosis not present

## 2022-04-20 DIAGNOSIS — R2689 Other abnormalities of gait and mobility: Secondary | ICD-10-CM | POA: Diagnosis not present

## 2022-04-22 DIAGNOSIS — R279 Unspecified lack of coordination: Secondary | ICD-10-CM | POA: Diagnosis not present

## 2022-04-22 DIAGNOSIS — M6281 Muscle weakness (generalized): Secondary | ICD-10-CM | POA: Diagnosis not present

## 2022-04-22 DIAGNOSIS — K6819 Other retroperitoneal abscess: Secondary | ICD-10-CM | POA: Diagnosis not present

## 2022-04-22 DIAGNOSIS — M6259 Muscle wasting and atrophy, not elsewhere classified, multiple sites: Secondary | ICD-10-CM | POA: Diagnosis not present

## 2022-04-22 DIAGNOSIS — R2689 Other abnormalities of gait and mobility: Secondary | ICD-10-CM | POA: Diagnosis not present

## 2022-04-22 DIAGNOSIS — R1311 Dysphagia, oral phase: Secondary | ICD-10-CM | POA: Diagnosis not present

## 2022-04-23 DIAGNOSIS — R1311 Dysphagia, oral phase: Secondary | ICD-10-CM | POA: Diagnosis not present

## 2022-04-23 DIAGNOSIS — M6281 Muscle weakness (generalized): Secondary | ICD-10-CM | POA: Diagnosis not present

## 2022-04-23 DIAGNOSIS — R279 Unspecified lack of coordination: Secondary | ICD-10-CM | POA: Diagnosis not present

## 2022-04-23 DIAGNOSIS — K6819 Other retroperitoneal abscess: Secondary | ICD-10-CM | POA: Diagnosis not present

## 2022-04-23 DIAGNOSIS — R2689 Other abnormalities of gait and mobility: Secondary | ICD-10-CM | POA: Diagnosis not present

## 2022-04-23 DIAGNOSIS — M6259 Muscle wasting and atrophy, not elsewhere classified, multiple sites: Secondary | ICD-10-CM | POA: Diagnosis not present

## 2022-04-24 DIAGNOSIS — R2689 Other abnormalities of gait and mobility: Secondary | ICD-10-CM | POA: Diagnosis not present

## 2022-04-24 DIAGNOSIS — K6819 Other retroperitoneal abscess: Secondary | ICD-10-CM | POA: Diagnosis not present

## 2022-04-24 DIAGNOSIS — R1311 Dysphagia, oral phase: Secondary | ICD-10-CM | POA: Diagnosis not present

## 2022-04-24 DIAGNOSIS — M6281 Muscle weakness (generalized): Secondary | ICD-10-CM | POA: Diagnosis not present

## 2022-04-24 DIAGNOSIS — M6259 Muscle wasting and atrophy, not elsewhere classified, multiple sites: Secondary | ICD-10-CM | POA: Diagnosis not present

## 2022-04-24 DIAGNOSIS — R279 Unspecified lack of coordination: Secondary | ICD-10-CM | POA: Diagnosis not present

## 2022-04-26 DIAGNOSIS — R2689 Other abnormalities of gait and mobility: Secondary | ICD-10-CM | POA: Diagnosis not present

## 2022-04-26 DIAGNOSIS — M6281 Muscle weakness (generalized): Secondary | ICD-10-CM | POA: Diagnosis not present

## 2022-04-26 DIAGNOSIS — R279 Unspecified lack of coordination: Secondary | ICD-10-CM | POA: Diagnosis not present

## 2022-04-26 DIAGNOSIS — M6259 Muscle wasting and atrophy, not elsewhere classified, multiple sites: Secondary | ICD-10-CM | POA: Diagnosis not present

## 2022-04-26 DIAGNOSIS — K6819 Other retroperitoneal abscess: Secondary | ICD-10-CM | POA: Diagnosis not present

## 2022-04-26 DIAGNOSIS — R1311 Dysphagia, oral phase: Secondary | ICD-10-CM | POA: Diagnosis not present

## 2022-04-29 ENCOUNTER — Other Ambulatory Visit: Payer: Self-pay | Admitting: *Deleted

## 2022-04-29 DIAGNOSIS — M6281 Muscle weakness (generalized): Secondary | ICD-10-CM | POA: Diagnosis not present

## 2022-04-29 DIAGNOSIS — R059 Cough, unspecified: Secondary | ICD-10-CM | POA: Diagnosis not present

## 2022-04-29 DIAGNOSIS — K6819 Other retroperitoneal abscess: Secondary | ICD-10-CM | POA: Diagnosis not present

## 2022-04-29 DIAGNOSIS — R1311 Dysphagia, oral phase: Secondary | ICD-10-CM | POA: Diagnosis not present

## 2022-04-29 DIAGNOSIS — R2689 Other abnormalities of gait and mobility: Secondary | ICD-10-CM | POA: Diagnosis not present

## 2022-04-29 DIAGNOSIS — M6259 Muscle wasting and atrophy, not elsewhere classified, multiple sites: Secondary | ICD-10-CM | POA: Diagnosis not present

## 2022-04-29 DIAGNOSIS — R279 Unspecified lack of coordination: Secondary | ICD-10-CM | POA: Diagnosis not present

## 2022-04-29 NOTE — Patient Outreach (Signed)
Per [facility] Katelyn Watson resides in [facility]. Screening for [facility] care coordination services as benefit of insurance plan and PCP.  Secure communication sent to SNF social worker to inquire about transition plans.   Will continue to follow.   Marthenia Rolling, MSN, RN,BSN [facility] Acute [facility] Coordinator [phone] (Direct dial)

## 2022-04-30 DIAGNOSIS — N390 Urinary tract infection, site not specified: Secondary | ICD-10-CM | POA: Diagnosis not present

## 2022-05-01 ENCOUNTER — Other Ambulatory Visit: Payer: Self-pay | Admitting: *Deleted

## 2022-05-01 DIAGNOSIS — R1311 Dysphagia, oral phase: Secondary | ICD-10-CM | POA: Diagnosis not present

## 2022-05-01 DIAGNOSIS — R2689 Other abnormalities of gait and mobility: Secondary | ICD-10-CM | POA: Diagnosis not present

## 2022-05-01 DIAGNOSIS — R279 Unspecified lack of coordination: Secondary | ICD-10-CM | POA: Diagnosis not present

## 2022-05-01 DIAGNOSIS — M6259 Muscle wasting and atrophy, not elsewhere classified, multiple sites: Secondary | ICD-10-CM | POA: Diagnosis not present

## 2022-05-01 DIAGNOSIS — M6281 Muscle weakness (generalized): Secondary | ICD-10-CM | POA: Diagnosis not present

## 2022-05-01 DIAGNOSIS — K6819 Other retroperitoneal abscess: Secondary | ICD-10-CM | POA: Diagnosis not present

## 2022-05-01 NOTE — Patient Outreach (Signed)
[facility] Post- Acute Care Coordinator follow up. Noted [facility] Coordinator outreach earlier this month.   Secure notification sent to [facility] Coordinator to make aware Mrs. Vandergriff resides in [facility] as long term care resident.   Marthenia Rolling, MSN, RN,BSN [facility] Acute Care Coordinator [phone] (Direct dial)

## 2022-05-01 NOTE — Patient Outreach (Signed)
[facility] Post- Acute Care Coordinator follow up. Mrs. Murrillo resides in [facility]. Screening for potential [facility] care coordination needs as benefit of insurance plan and PCP.  Update received from [place], [place] social worker indicating Mrs. Khokhar is a long term care resident.   No identifiable [facility] care coordination needs.   Marthenia Rolling, MSN, RN,BSN [facility] Acute Care Coordinator [phone] (Direct dial)

## 2022-05-02 DIAGNOSIS — F5105 Insomnia due to other mental disorder: Secondary | ICD-10-CM | POA: Diagnosis not present

## 2022-05-02 DIAGNOSIS — R630 Anorexia: Secondary | ICD-10-CM | POA: Diagnosis not present

## 2022-05-02 DIAGNOSIS — F332 Major depressive disorder, recurrent severe without psychotic features: Secondary | ICD-10-CM | POA: Diagnosis not present

## 2022-05-07 ENCOUNTER — Telehealth: Payer: Self-pay

## 2022-05-07 NOTE — Patient Outreach (Signed)
  Care Coordination   05/07/2022 Name: Katelyn Watson MRN: [id number] DOB: [birth year]   Care Coordination Outreach Attempts:  A third unsuccessful outreach was attempted today to offer the patient with information about available care coordination services as a benefit of their health plan.   Follow Up Plan:  No further outreach attempts will be made at this time. We have been unable to contact the patient to offer or enroll patient in care coordination services  Encounter Outcome:  No Answer   Care Coordination Interventions:  No, not indicated    Tomasa Rand, RN, BSN, CEN [facility] Coordinator [phone]

## 2022-05-18 DIAGNOSIS — F0390 Unspecified dementia without behavioral disturbance: Secondary | ICD-10-CM | POA: Diagnosis not present

## 2022-05-18 DIAGNOSIS — N184 Chronic kidney disease, stage 4 (severe): Secondary | ICD-10-CM | POA: Diagnosis not present

## 2022-05-18 DIAGNOSIS — D649 Anemia, unspecified: Secondary | ICD-10-CM | POA: Diagnosis not present

## 2022-05-18 DIAGNOSIS — E441 Mild protein-calorie malnutrition: Secondary | ICD-10-CM | POA: Diagnosis not present

## 2022-05-22 DIAGNOSIS — N321 Vesicointestinal fistula: Secondary | ICD-10-CM | POA: Diagnosis not present

## 2022-05-22 DIAGNOSIS — Z8719 Personal history of other diseases of the digestive system: Secondary | ICD-10-CM | POA: Diagnosis not present

## 2022-05-22 DIAGNOSIS — Z7401 Bed confinement status: Secondary | ICD-10-CM | POA: Diagnosis not present

## 2022-05-22 DIAGNOSIS — I509 Heart failure, unspecified: Secondary | ICD-10-CM | POA: Diagnosis not present

## 2022-05-22 DIAGNOSIS — D649 Anemia, unspecified: Secondary | ICD-10-CM | POA: Diagnosis not present

## 2022-05-22 DIAGNOSIS — R69 Illness, unspecified: Secondary | ICD-10-CM | POA: Diagnosis not present

## 2022-05-22 DIAGNOSIS — I130 Hypertensive heart and chronic kidney disease with heart failure and stage 1 through stage 4 chronic kidney disease, or unspecified chronic kidney disease: Secondary | ICD-10-CM | POA: Diagnosis not present

## 2022-05-22 DIAGNOSIS — E785 Hyperlipidemia, unspecified: Secondary | ICD-10-CM | POA: Diagnosis not present

## 2022-05-22 DIAGNOSIS — R279 Unspecified lack of coordination: Secondary | ICD-10-CM | POA: Diagnosis not present

## 2022-05-22 DIAGNOSIS — E43 Unspecified severe protein-calorie malnutrition: Secondary | ICD-10-CM | POA: Diagnosis not present

## 2022-05-22 DIAGNOSIS — Z87442 Personal history of urinary calculi: Secondary | ICD-10-CM | POA: Diagnosis not present

## 2022-05-22 DIAGNOSIS — Z853 Personal history of malignant neoplasm of breast: Secondary | ICD-10-CM | POA: Diagnosis not present

## 2022-05-22 DIAGNOSIS — E039 Hypothyroidism, unspecified: Secondary | ICD-10-CM | POA: Diagnosis not present

## 2022-05-22 DIAGNOSIS — Z87448 Personal history of other diseases of urinary system: Secondary | ICD-10-CM | POA: Diagnosis not present

## 2022-05-22 DIAGNOSIS — K219 Gastro-esophageal reflux disease without esophagitis: Secondary | ICD-10-CM | POA: Diagnosis not present

## 2022-05-22 DIAGNOSIS — F32A Depression, unspecified: Secondary | ICD-10-CM | POA: Diagnosis not present

## 2022-05-22 DIAGNOSIS — Z960 Presence of urogenital implants: Secondary | ICD-10-CM | POA: Diagnosis not present

## 2022-05-22 DIAGNOSIS — Z87440 Personal history of urinary (tract) infections: Secondary | ICD-10-CM | POA: Diagnosis not present

## 2022-05-22 DIAGNOSIS — L89322 Pressure ulcer of left buttock, stage 2: Secondary | ICD-10-CM | POA: Diagnosis not present

## 2022-05-22 DIAGNOSIS — N182 Chronic kidney disease, stage 2 (mild): Secondary | ICD-10-CM | POA: Diagnosis not present

## 2022-05-23 DIAGNOSIS — Z8719 Personal history of other diseases of the digestive system: Secondary | ICD-10-CM | POA: Diagnosis not present

## 2022-05-23 DIAGNOSIS — Z87448 Personal history of other diseases of urinary system: Secondary | ICD-10-CM | POA: Diagnosis not present

## 2022-05-23 DIAGNOSIS — E43 Unspecified severe protein-calorie malnutrition: Secondary | ICD-10-CM | POA: Diagnosis not present

## 2022-05-23 DIAGNOSIS — Z87442 Personal history of urinary calculi: Secondary | ICD-10-CM | POA: Diagnosis not present

## 2022-05-23 DIAGNOSIS — Z960 Presence of urogenital implants: Secondary | ICD-10-CM | POA: Diagnosis not present

## 2022-05-23 DIAGNOSIS — N321 Vesicointestinal fistula: Secondary | ICD-10-CM | POA: Diagnosis not present

## 2022-05-24 DIAGNOSIS — Z87448 Personal history of other diseases of urinary system: Secondary | ICD-10-CM | POA: Diagnosis not present

## 2022-05-24 DIAGNOSIS — N321 Vesicointestinal fistula: Secondary | ICD-10-CM | POA: Diagnosis not present

## 2022-05-24 DIAGNOSIS — Z87442 Personal history of urinary calculi: Secondary | ICD-10-CM | POA: Diagnosis not present

## 2022-05-24 DIAGNOSIS — E43 Unspecified severe protein-calorie malnutrition: Secondary | ICD-10-CM | POA: Diagnosis not present

## 2022-05-24 DIAGNOSIS — Z8719 Personal history of other diseases of the digestive system: Secondary | ICD-10-CM | POA: Diagnosis not present

## 2022-05-24 DIAGNOSIS — Z960 Presence of urogenital implants: Secondary | ICD-10-CM | POA: Diagnosis not present

## 2022-06-03 DEATH — deceased
